# Patient Record
Sex: Female | Born: 1940 | Race: White | Hispanic: No | Smoking: Former smoker
Health system: Southern US, Community
[De-identification: ages and names within clinical notes are randomized; demographics above are authoritative.]

## PROBLEM LIST (undated history)

## (undated) DIAGNOSIS — E785 Hyperlipidemia, unspecified: Secondary | ICD-10-CM

## (undated) DIAGNOSIS — K219 Gastro-esophageal reflux disease without esophagitis: Secondary | ICD-10-CM

## (undated) DIAGNOSIS — C801 Malignant (primary) neoplasm, unspecified: Secondary | ICD-10-CM

## (undated) DIAGNOSIS — R112 Nausea with vomiting, unspecified: Secondary | ICD-10-CM

## (undated) DIAGNOSIS — R519 Headache, unspecified: Secondary | ICD-10-CM

## (undated) DIAGNOSIS — M1990 Unspecified osteoarthritis, unspecified site: Secondary | ICD-10-CM

## (undated) DIAGNOSIS — K589 Irritable bowel syndrome without diarrhea: Secondary | ICD-10-CM

## (undated) DIAGNOSIS — Z972 Presence of dental prosthetic device (complete) (partial): Secondary | ICD-10-CM

## (undated) DIAGNOSIS — Z9889 Other specified postprocedural states: Secondary | ICD-10-CM

## (undated) DIAGNOSIS — I10 Essential (primary) hypertension: Secondary | ICD-10-CM

## (undated) DIAGNOSIS — R51 Headache: Secondary | ICD-10-CM

## (undated) DIAGNOSIS — F419 Anxiety disorder, unspecified: Secondary | ICD-10-CM

## (undated) HISTORY — DX: Essential (primary) hypertension: I10

## (undated) HISTORY — PX: BREAST SURGERY: SHX581

## (undated) HISTORY — PX: HERNIA REPAIR: SHX51

## (undated) HISTORY — PX: CARDIAC CATHETERIZATION: SHX172

## (undated) HISTORY — DX: Hyperlipidemia, unspecified: E78.5

## (undated) HISTORY — DX: Gastro-esophageal reflux disease without esophagitis: K21.9

## (undated) HISTORY — PX: CHOLECYSTECTOMY: SHX55

## (undated) HISTORY — PX: TONSILLECTOMY: SUR1361

## (undated) HISTORY — PX: SHOULDER ARTHROSCOPY W/ ROTATOR CUFF REPAIR: SHX2400

## (undated) HISTORY — DX: Malignant (primary) neoplasm, unspecified: C80.1

---

## 2000-04-20 ENCOUNTER — Encounter (INDEPENDENT_AMBULATORY_CARE_PROVIDER_SITE_OTHER): Payer: Self-pay | Admitting: Specialist

## 2000-04-20 ENCOUNTER — Other Ambulatory Visit: Admission: RE | Admit: 2000-04-20 | Discharge: 2000-04-20 | Payer: Self-pay | Admitting: Gastroenterology

## 2000-09-07 ENCOUNTER — Ambulatory Visit (HOSPITAL_COMMUNITY): Admission: RE | Admit: 2000-09-07 | Discharge: 2000-09-07 | Payer: Self-pay | Admitting: Family Medicine

## 2000-09-07 ENCOUNTER — Encounter: Payer: Self-pay | Admitting: Family Medicine

## 2000-09-13 ENCOUNTER — Ambulatory Visit (HOSPITAL_COMMUNITY): Admission: RE | Admit: 2000-09-13 | Discharge: 2000-09-13 | Payer: Self-pay | Admitting: Family Medicine

## 2000-09-13 ENCOUNTER — Encounter: Payer: Self-pay | Admitting: Family Medicine

## 2000-09-30 ENCOUNTER — Encounter: Payer: Self-pay | Admitting: *Deleted

## 2000-09-30 ENCOUNTER — Emergency Department (HOSPITAL_COMMUNITY): Admission: EM | Admit: 2000-09-30 | Discharge: 2000-09-30 | Payer: Self-pay | Admitting: *Deleted

## 2001-02-15 ENCOUNTER — Ambulatory Visit (HOSPITAL_COMMUNITY): Admission: RE | Admit: 2001-02-15 | Discharge: 2001-02-15 | Payer: Self-pay | Admitting: General Surgery

## 2001-03-15 HISTORY — PX: MASTECTOMY: SHX3

## 2001-03-16 ENCOUNTER — Encounter: Payer: Self-pay | Admitting: Family Medicine

## 2001-03-16 ENCOUNTER — Ambulatory Visit (HOSPITAL_COMMUNITY): Admission: RE | Admit: 2001-03-16 | Discharge: 2001-03-16 | Payer: Self-pay | Admitting: Family Medicine

## 2001-03-21 ENCOUNTER — Encounter (INDEPENDENT_AMBULATORY_CARE_PROVIDER_SITE_OTHER): Payer: Self-pay | Admitting: Specialist

## 2001-03-21 ENCOUNTER — Encounter: Payer: Self-pay | Admitting: Family Medicine

## 2001-03-21 ENCOUNTER — Encounter: Admission: RE | Admit: 2001-03-21 | Discharge: 2001-03-21 | Payer: Self-pay | Admitting: Family Medicine

## 2001-03-24 ENCOUNTER — Encounter: Admission: RE | Admit: 2001-03-24 | Discharge: 2001-03-24 | Payer: Self-pay | Admitting: General Surgery

## 2001-03-24 ENCOUNTER — Encounter: Payer: Self-pay | Admitting: General Surgery

## 2001-03-28 ENCOUNTER — Encounter: Admission: RE | Admit: 2001-03-28 | Discharge: 2001-03-28 | Payer: Self-pay | Admitting: General Surgery

## 2001-03-28 ENCOUNTER — Ambulatory Visit (HOSPITAL_BASED_OUTPATIENT_CLINIC_OR_DEPARTMENT_OTHER): Admission: RE | Admit: 2001-03-28 | Discharge: 2001-03-28 | Payer: Self-pay | Admitting: General Surgery

## 2001-03-28 ENCOUNTER — Encounter (INDEPENDENT_AMBULATORY_CARE_PROVIDER_SITE_OTHER): Payer: Self-pay | Admitting: Specialist

## 2001-03-28 ENCOUNTER — Encounter: Payer: Self-pay | Admitting: General Surgery

## 2001-04-06 ENCOUNTER — Ambulatory Visit: Admission: RE | Admit: 2001-04-06 | Discharge: 2001-07-05 | Payer: Self-pay | Admitting: *Deleted

## 2001-04-26 ENCOUNTER — Encounter: Admission: RE | Admit: 2001-04-26 | Discharge: 2001-04-26 | Payer: Self-pay | Admitting: *Deleted

## 2001-05-01 ENCOUNTER — Ambulatory Visit (HOSPITAL_COMMUNITY): Admission: RE | Admit: 2001-05-01 | Discharge: 2001-05-01 | Payer: Self-pay | Admitting: *Deleted

## 2001-05-01 ENCOUNTER — Encounter: Payer: Self-pay | Admitting: *Deleted

## 2001-05-03 ENCOUNTER — Ambulatory Visit (HOSPITAL_BASED_OUTPATIENT_CLINIC_OR_DEPARTMENT_OTHER): Admission: RE | Admit: 2001-05-03 | Discharge: 2001-05-03 | Payer: Self-pay | Admitting: General Surgery

## 2001-05-03 ENCOUNTER — Encounter: Payer: Self-pay | Admitting: General Surgery

## 2001-05-05 ENCOUNTER — Ambulatory Visit (HOSPITAL_COMMUNITY): Admission: RE | Admit: 2001-05-05 | Discharge: 2001-05-05 | Payer: Self-pay | Admitting: *Deleted

## 2001-05-05 ENCOUNTER — Encounter: Payer: Self-pay | Admitting: *Deleted

## 2001-05-16 ENCOUNTER — Encounter: Payer: Self-pay | Admitting: *Deleted

## 2001-05-16 ENCOUNTER — Emergency Department (HOSPITAL_COMMUNITY): Admission: EM | Admit: 2001-05-16 | Discharge: 2001-05-16 | Payer: Self-pay | Admitting: Emergency Medicine

## 2001-07-26 ENCOUNTER — Ambulatory Visit: Admission: RE | Admit: 2001-07-26 | Discharge: 2001-10-24 | Payer: Self-pay | Admitting: *Deleted

## 2001-10-04 ENCOUNTER — Ambulatory Visit (HOSPITAL_BASED_OUTPATIENT_CLINIC_OR_DEPARTMENT_OTHER): Admission: RE | Admit: 2001-10-04 | Discharge: 2001-10-04 | Payer: Self-pay | Admitting: General Surgery

## 2001-12-15 ENCOUNTER — Encounter: Admission: RE | Admit: 2001-12-15 | Discharge: 2001-12-15 | Payer: Self-pay | Admitting: General Surgery

## 2001-12-15 ENCOUNTER — Encounter: Payer: Self-pay | Admitting: General Surgery

## 2002-02-23 ENCOUNTER — Encounter: Payer: Self-pay | Admitting: *Deleted

## 2002-02-23 ENCOUNTER — Ambulatory Visit (HOSPITAL_COMMUNITY): Admission: RE | Admit: 2002-02-23 | Discharge: 2002-02-23 | Payer: Self-pay

## 2002-07-09 ENCOUNTER — Emergency Department (HOSPITAL_COMMUNITY): Admission: EM | Admit: 2002-07-09 | Discharge: 2002-07-09 | Payer: Self-pay | Admitting: Emergency Medicine

## 2002-07-11 ENCOUNTER — Encounter: Admission: RE | Admit: 2002-07-11 | Discharge: 2002-07-11 | Payer: Self-pay | Admitting: *Deleted

## 2002-07-11 ENCOUNTER — Encounter: Payer: Self-pay | Admitting: *Deleted

## 2002-07-26 ENCOUNTER — Other Ambulatory Visit: Admission: RE | Admit: 2002-07-26 | Discharge: 2002-07-26 | Payer: Self-pay | Admitting: *Deleted

## 2002-09-13 ENCOUNTER — Encounter (INDEPENDENT_AMBULATORY_CARE_PROVIDER_SITE_OTHER): Payer: Self-pay | Admitting: *Deleted

## 2002-09-13 ENCOUNTER — Ambulatory Visit (HOSPITAL_COMMUNITY): Admission: RE | Admit: 2002-09-13 | Discharge: 2002-09-13 | Payer: Self-pay | Admitting: *Deleted

## 2002-12-17 ENCOUNTER — Encounter: Payer: Self-pay | Admitting: Oncology

## 2002-12-17 ENCOUNTER — Encounter: Admission: RE | Admit: 2002-12-17 | Discharge: 2002-12-17 | Payer: Self-pay | Admitting: Oncology

## 2003-12-18 ENCOUNTER — Encounter (INDEPENDENT_AMBULATORY_CARE_PROVIDER_SITE_OTHER): Payer: Self-pay | Admitting: *Deleted

## 2003-12-18 ENCOUNTER — Encounter: Admission: RE | Admit: 2003-12-18 | Discharge: 2003-12-18 | Payer: Self-pay | Admitting: General Surgery

## 2004-04-17 ENCOUNTER — Ambulatory Visit: Payer: Self-pay | Admitting: Oncology

## 2004-06-17 ENCOUNTER — Encounter: Admission: RE | Admit: 2004-06-17 | Discharge: 2004-06-17 | Payer: Self-pay | Admitting: Oncology

## 2004-12-21 ENCOUNTER — Encounter: Admission: RE | Admit: 2004-12-21 | Discharge: 2004-12-21 | Payer: Self-pay | Admitting: Oncology

## 2005-04-16 ENCOUNTER — Ambulatory Visit: Payer: Self-pay | Admitting: Oncology

## 2005-04-26 ENCOUNTER — Ambulatory Visit: Payer: Self-pay | Admitting: Gastroenterology

## 2005-04-27 ENCOUNTER — Ambulatory Visit: Payer: Self-pay | Admitting: Gastroenterology

## 2005-10-28 ENCOUNTER — Ambulatory Visit: Payer: Self-pay | Admitting: Oncology

## 2005-11-03 ENCOUNTER — Ambulatory Visit: Payer: Self-pay | Admitting: Oncology

## 2005-11-03 LAB — CBC WITH DIFFERENTIAL/PLATELET
BASO%: 0.4 % (ref 0.0–2.0)
Basophils Absolute: 0 10*3/uL (ref 0.0–0.1)
EOS%: 2.5 % (ref 0.0–7.0)
Eosinophils Absolute: 0.2 10*3/uL (ref 0.0–0.5)
HCT: 38.9 % (ref 34.8–46.6)
HGB: 13.7 g/dL (ref 11.6–15.9)
LYMPH%: 34.6 % (ref 14.0–48.0)
MCH: 30.5 pg (ref 26.0–34.0)
MCHC: 35.2 g/dL (ref 32.0–36.0)
MCV: 86.4 fL (ref 81.0–101.0)
MONO#: 0.4 10*3/uL (ref 0.1–0.9)
MONO%: 7 % (ref 0.0–13.0)
NEUT#: 3.4 10*3/uL (ref 1.5–6.5)
NEUT%: 55.5 % (ref 39.6–76.8)
Platelets: 212 10*3/uL (ref 145–400)
RBC: 4.5 10*6/uL (ref 3.70–5.32)
RDW: 12.3 % (ref 11.3–14.5)
WBC: 6.1 10*3/uL (ref 3.9–10.0)
lymph#: 2.1 10*3/uL (ref 0.9–3.3)

## 2005-12-13 ENCOUNTER — Encounter: Admission: RE | Admit: 2005-12-13 | Discharge: 2005-12-13 | Payer: Self-pay | Admitting: Oncology

## 2006-04-08 ENCOUNTER — Ambulatory Visit: Payer: Self-pay | Admitting: Oncology

## 2006-04-12 LAB — CBC WITH DIFFERENTIAL/PLATELET
BASO%: 0.5 % (ref 0.0–2.0)
Basophils Absolute: 0 10*3/uL (ref 0.0–0.1)
EOS%: 2.6 % (ref 0.0–7.0)
Eosinophils Absolute: 0.2 10*3/uL (ref 0.0–0.5)
HCT: 41.1 % (ref 34.8–46.6)
HGB: 14.2 g/dL (ref 11.6–15.9)
LYMPH%: 32.5 % (ref 14.0–48.0)
MCH: 30.9 pg (ref 26.0–34.0)
MCHC: 34.7 g/dL (ref 32.0–36.0)
MCV: 89 fL (ref 81.0–101.0)
MONO#: 0.5 10*3/uL (ref 0.1–0.9)
MONO%: 7 % (ref 0.0–13.0)
NEUT#: 3.7 10*3/uL (ref 1.5–6.5)
NEUT%: 57.4 % (ref 39.6–76.8)
Platelets: 260 10*3/uL (ref 145–400)
RBC: 4.61 10*6/uL (ref 3.70–5.32)
RDW: 12.1 % (ref 11.3–14.5)
WBC: 6.5 10*3/uL (ref 3.9–10.0)
lymph#: 2.1 10*3/uL (ref 0.9–3.3)

## 2006-04-12 LAB — COMPREHENSIVE METABOLIC PANEL
ALT: 18 U/L (ref 0–35)
Albumin: 4.6 g/dL (ref 3.5–5.2)
CO2: 27 mEq/L (ref 19–32)
Calcium: 9.5 mg/dL (ref 8.4–10.5)
Chloride: 100 mEq/L (ref 96–112)
Glucose, Bld: 168 mg/dL — ABNORMAL HIGH (ref 70–99)
Potassium: 4.1 mEq/L (ref 3.5–5.3)
Sodium: 137 mEq/L (ref 135–145)
Total Bilirubin: 0.6 mg/dL (ref 0.3–1.2)
Total Protein: 7.1 g/dL (ref 6.0–8.3)

## 2006-04-12 LAB — CANCER ANTIGEN 27.29: CA 27.29: 29 U/mL (ref 0–39)

## 2006-07-14 ENCOUNTER — Encounter: Admission: RE | Admit: 2006-07-14 | Discharge: 2006-07-14 | Payer: Self-pay | Admitting: Oncology

## 2006-11-09 ENCOUNTER — Ambulatory Visit: Payer: Self-pay | Admitting: Oncology

## 2006-11-10 LAB — CBC WITH DIFFERENTIAL/PLATELET
BASO%: 0.5 % (ref 0.0–2.0)
EOS%: 2.9 % (ref 0.0–7.0)
MCH: 30.7 pg (ref 26.0–34.0)
MCHC: 35.5 g/dL (ref 32.0–36.0)
MONO#: 0.4 10*3/uL (ref 0.1–0.9)
NEUT%: 54.4 % (ref 39.6–76.8)
RBC: 4.37 10*6/uL (ref 3.70–5.32)
RDW: 12.3 % (ref 11.3–14.5)
WBC: 6.1 10*3/uL (ref 3.9–10.0)
lymph#: 2.2 10*3/uL (ref 0.9–3.3)

## 2006-11-14 LAB — CANCER ANTIGEN 27.29: CA 27.29: 22 U/mL (ref 0–39)

## 2006-11-14 LAB — COMPREHENSIVE METABOLIC PANEL
ALT: 11 U/L (ref 0–35)
AST: 11 U/L (ref 0–37)
CO2: 24 mEq/L (ref 19–32)
Calcium: 9.8 mg/dL (ref 8.4–10.5)
Chloride: 101 mEq/L (ref 96–112)
Creatinine, Ser: 0.75 mg/dL (ref 0.40–1.20)
Sodium: 138 mEq/L (ref 135–145)
Total Bilirubin: 0.5 mg/dL (ref 0.3–1.2)
Total Protein: 6.6 g/dL (ref 6.0–8.3)

## 2006-11-14 LAB — VITAMIN D PNL(25-HYDRXY+1,25-DIHY)-BLD: Vit D, 1,25-Dihydroxy: 55 pg/mL (ref 6–62)

## 2006-12-15 ENCOUNTER — Encounter: Admission: RE | Admit: 2006-12-15 | Discharge: 2006-12-15 | Payer: Self-pay | Admitting: Oncology

## 2007-04-21 ENCOUNTER — Ambulatory Visit: Payer: Self-pay | Admitting: Oncology

## 2007-04-25 LAB — CBC WITH DIFFERENTIAL/PLATELET
BASO%: 0.4 % (ref 0.0–2.0)
Basophils Absolute: 0 10*3/uL (ref 0.0–0.1)
EOS%: 1.5 % (ref 0.0–7.0)
Eosinophils Absolute: 0.1 10*3/uL (ref 0.0–0.5)
HCT: 40.7 % (ref 34.8–46.6)
HGB: 13.4 g/dL (ref 11.6–15.9)
LYMPH%: 24.2 % (ref 14.0–48.0)
MCH: 28.3 pg (ref 26.0–34.0)
MCHC: 32.9 g/dL (ref 32.0–36.0)
MCV: 86 fL (ref 81.0–101.0)
MONO#: 0.5 10*3/uL (ref 0.1–0.9)
MONO%: 5.7 % (ref 0.0–13.0)
NEUT#: 5.5 10*3/uL (ref 1.5–6.5)
NEUT%: 68.2 % (ref 39.6–76.8)
Platelets: 272 10*3/uL (ref 145–400)
RBC: 4.73 10*6/uL (ref 3.70–5.32)
RDW: 12.4 % (ref 11.3–14.5)
WBC: 8.1 10*3/uL (ref 3.9–10.0)
lymph#: 2 10*3/uL (ref 0.9–3.3)

## 2007-04-25 LAB — COMPREHENSIVE METABOLIC PANEL
ALT: 19 U/L (ref 0–35)
AST: 20 U/L (ref 0–37)
Albumin: 4 g/dL (ref 3.5–5.2)
Alkaline Phosphatase: 52 U/L (ref 39–117)
BUN: 8 mg/dL (ref 6–23)
CO2: 31 mEq/L (ref 19–32)
Calcium: 10.1 mg/dL (ref 8.4–10.5)
Chloride: 101 mEq/L (ref 96–112)
Creatinine, Ser: 0.74 mg/dL (ref 0.40–1.20)
Glucose, Bld: 166 mg/dL — ABNORMAL HIGH (ref 70–99)
Potassium: 3.3 mEq/L — ABNORMAL LOW (ref 3.5–5.3)
Sodium: 140 mEq/L (ref 135–145)
Total Bilirubin: 0.9 mg/dL (ref 0.3–1.2)
Total Protein: 6.7 g/dL (ref 6.0–8.3)

## 2007-04-25 LAB — CANCER ANTIGEN 27.29: CA 27.29: 17 U/mL (ref 0–39)

## 2007-11-03 ENCOUNTER — Encounter: Admission: RE | Admit: 2007-11-03 | Discharge: 2007-11-03 | Payer: Self-pay | Admitting: General Surgery

## 2007-12-19 ENCOUNTER — Encounter: Admission: RE | Admit: 2007-12-19 | Discharge: 2007-12-19 | Payer: Self-pay | Admitting: General Surgery

## 2008-04-12 ENCOUNTER — Ambulatory Visit: Payer: Self-pay | Admitting: Oncology

## 2008-04-16 LAB — COMPREHENSIVE METABOLIC PANEL
ALT: 11 U/L (ref 0–35)
AST: 12 U/L (ref 0–37)
Albumin: 4.1 g/dL (ref 3.5–5.2)
Alkaline Phosphatase: 61 U/L (ref 39–117)
BUN: 11 mg/dL (ref 6–23)
CO2: 27 mEq/L (ref 19–32)
Calcium: 10 mg/dL (ref 8.4–10.5)
Chloride: 99 mEq/L (ref 96–112)
Creatinine, Ser: 0.67 mg/dL (ref 0.40–1.20)
Glucose, Bld: 197 mg/dL — ABNORMAL HIGH (ref 70–99)
Potassium: 4.4 mEq/L (ref 3.5–5.3)
Sodium: 136 mEq/L (ref 135–145)
Total Bilirubin: 0.3 mg/dL (ref 0.3–1.2)
Total Protein: 6.7 g/dL (ref 6.0–8.3)

## 2008-04-16 LAB — CBC WITH DIFFERENTIAL/PLATELET
BASO%: 0.3 % (ref 0.0–2.0)
Basophils Absolute: 0 10*3/uL (ref 0.0–0.1)
EOS%: 2.2 % (ref 0.0–7.0)
Eosinophils Absolute: 0.1 10*3/uL (ref 0.0–0.5)
HCT: 37 % (ref 34.8–46.6)
HGB: 12.8 g/dL (ref 11.6–15.9)
LYMPH%: 28.5 % (ref 14.0–48.0)
MCH: 30.4 pg (ref 26.0–34.0)
MCHC: 34.6 g/dL (ref 32.0–36.0)
MCV: 87.8 fL (ref 81.0–101.0)
MONO#: 0.4 10*3/uL (ref 0.1–0.9)
MONO%: 5.8 % (ref 0.0–13.0)
NEUT#: 4.2 10*3/uL (ref 1.5–6.5)
NEUT%: 63.2 % (ref 39.6–76.8)
Platelets: ADEQUATE 10*3/uL (ref 145–400)
RBC: 4.21 10*6/uL (ref 3.70–5.32)
RDW: 12.3 % (ref 11.3–14.5)
WBC: 6.7 10*3/uL (ref 3.9–10.0)
lymph#: 1.9 10*3/uL (ref 0.9–3.3)

## 2008-04-16 LAB — CANCER ANTIGEN 27.29: CA 27.29: 17 U/mL (ref 0–39)

## 2008-05-28 ENCOUNTER — Encounter: Admission: RE | Admit: 2008-05-28 | Discharge: 2008-07-23 | Payer: Self-pay | Admitting: Endocrinology

## 2008-09-24 ENCOUNTER — Encounter: Admission: RE | Admit: 2008-09-24 | Discharge: 2008-09-24 | Payer: Self-pay | Admitting: Endocrinology

## 2008-12-19 ENCOUNTER — Encounter: Admission: RE | Admit: 2008-12-19 | Discharge: 2008-12-19 | Payer: Self-pay | Admitting: General Surgery

## 2009-04-18 ENCOUNTER — Ambulatory Visit: Payer: Self-pay | Admitting: Oncology

## 2009-04-22 ENCOUNTER — Encounter: Payer: Self-pay | Admitting: Gastroenterology

## 2009-04-22 LAB — COMPREHENSIVE METABOLIC PANEL
ALT: 13 U/L (ref 0–35)
AST: 17 U/L (ref 0–37)
Albumin: 4.1 g/dL (ref 3.5–5.2)
Alkaline Phosphatase: 52 U/L (ref 39–117)
BUN: 11 mg/dL (ref 6–23)
CO2: 30 mEq/L (ref 19–32)
Calcium: 9.9 mg/dL (ref 8.4–10.5)
Chloride: 97 mEq/L (ref 96–112)
Creatinine, Ser: 0.71 mg/dL (ref 0.40–1.20)
Glucose, Bld: 109 mg/dL — ABNORMAL HIGH (ref 70–99)
Potassium: 3.8 mEq/L (ref 3.5–5.3)
Sodium: 136 mEq/L (ref 135–145)
Total Bilirubin: 0.6 mg/dL (ref 0.3–1.2)
Total Protein: 7.2 g/dL (ref 6.0–8.3)

## 2009-04-22 LAB — CBC WITH DIFFERENTIAL/PLATELET
BASO%: 0.5 % (ref 0.0–2.0)
Basophils Absolute: 0 10*3/uL (ref 0.0–0.1)
EOS%: 2.6 % (ref 0.0–7.0)
Eosinophils Absolute: 0.2 10*3/uL (ref 0.0–0.5)
HCT: 39.3 % (ref 34.8–46.6)
HGB: 13.6 g/dL (ref 11.6–15.9)
LYMPH%: 29.6 % (ref 14.0–49.7)
MCH: 30.5 pg (ref 25.1–34.0)
MCHC: 34.5 g/dL (ref 31.5–36.0)
MCV: 88.2 fL (ref 79.5–101.0)
MONO#: 0.5 10*3/uL (ref 0.1–0.9)
MONO%: 7 % (ref 0.0–14.0)
NEUT#: 3.9 10*3/uL (ref 1.5–6.5)
NEUT%: 60.3 % (ref 38.4–76.8)
Platelets: 228 10*3/uL (ref 145–400)
RBC: 4.45 10*6/uL (ref 3.70–5.45)
RDW: 12 % (ref 11.2–14.5)
WBC: 6.5 10*3/uL (ref 3.9–10.3)
lymph#: 1.9 10*3/uL (ref 0.9–3.3)

## 2009-12-19 ENCOUNTER — Encounter: Admission: RE | Admit: 2009-12-19 | Discharge: 2009-12-19 | Payer: Self-pay | Admitting: Family Medicine

## 2010-04-08 ENCOUNTER — Encounter: Payer: Self-pay | Admitting: Gastroenterology

## 2010-04-16 NOTE — Letter (Signed)
[facility]   Imported By: Sherian Rein 05/12/2009 11:04:46  _____________________________________________________________________  External Attachment:    Type:   Image     Comment:   External Document

## 2010-04-16 NOTE — Letter (Signed)
Summary: Colonoscopy Letter  [facility]  [address] [place], [place] [address]   Phone: [phone]  Fax: [phone]      April 08, 2010 MRN: [id number]   Jiya Mizell [address] [place], [place]  [address]   Dear Ms. Vejar,   According to your medical record, it is time for you to schedule a Colonoscopy. The American Cancer Society recommends this procedure as a method to detect early colon cancer. Patients with a family history of colon cancer, or a personal history of colon polyps or inflammatory bowel disease are at increased risk.  This letter has been generated based on the recommendations made at the time of your procedure. If you feel that in your particular situation this may no longer apply, please contact our office.  Please call our office at [phone] to schedule this appointment or to update your records at your earliest convenience.  Thank you for cooperating with [place] to provide you with the very best care possible.   Sincerely,  Judie Petit T. Russella Dar, M.D.  [facility] [phone]

## 2010-04-23 ENCOUNTER — Encounter: Payer: Self-pay | Admitting: Gastroenterology

## 2010-04-23 ENCOUNTER — Encounter (HOSPITAL_BASED_OUTPATIENT_CLINIC_OR_DEPARTMENT_OTHER): Payer: MEDICARE | Admitting: Oncology

## 2010-04-23 ENCOUNTER — Other Ambulatory Visit: Payer: Self-pay | Admitting: Oncology

## 2010-04-23 DIAGNOSIS — C50419 Malignant neoplasm of upper-outer quadrant of unspecified female breast: Secondary | ICD-10-CM

## 2010-04-23 DIAGNOSIS — Z170 Estrogen receptor positive status [ER+]: Secondary | ICD-10-CM

## 2010-04-23 LAB — COMPREHENSIVE METABOLIC PANEL
ALT: 13 U/L (ref 0–35)
AST: 17 U/L (ref 0–37)
Albumin: 3.9 g/dL (ref 3.5–5.2)
Alkaline Phosphatase: 47 U/L (ref 39–117)
BUN: 11 mg/dL (ref 6–23)
CO2: 30 mEq/L (ref 19–32)
Calcium: 9.7 mg/dL (ref 8.4–10.5)
Chloride: 101 mEq/L (ref 96–112)
Creatinine, Ser: 0.76 mg/dL (ref 0.40–1.20)
Glucose, Bld: 131 mg/dL — ABNORMAL HIGH (ref 70–99)
Potassium: 3.3 mEq/L — ABNORMAL LOW (ref 3.5–5.3)
Sodium: 139 mEq/L (ref 135–145)
Total Bilirubin: 0.9 mg/dL (ref 0.3–1.2)
Total Protein: 6.6 g/dL (ref 6.0–8.3)

## 2010-04-23 LAB — CBC WITH DIFFERENTIAL/PLATELET
BASO%: 0.5 % (ref 0.0–2.0)
Basophils Absolute: 0 10*3/uL (ref 0.0–0.1)
EOS%: 1.8 % (ref 0.0–7.0)
Eosinophils Absolute: 0.1 10*3/uL (ref 0.0–0.5)
HCT: 38.1 % (ref 34.8–46.6)
HGB: 13.1 g/dL (ref 11.6–15.9)
LYMPH%: 23.7 % (ref 14.0–49.7)
MCH: 30.2 pg (ref 25.1–34.0)
MCHC: 34.5 g/dL (ref 31.5–36.0)
MCV: 87.5 fL (ref 79.5–101.0)
MONO#: 0.4 10*3/uL (ref 0.1–0.9)
MONO%: 6.8 % (ref 0.0–14.0)
NEUT#: 4.3 10*3/uL (ref 1.5–6.5)
NEUT%: 67.2 % (ref 38.4–76.8)
Platelets: 195 10*3/uL (ref 145–400)
RBC: 4.35 10*6/uL (ref 3.70–5.45)
RDW: 12.4 % (ref 11.2–14.5)
WBC: 6.4 10*3/uL (ref 3.9–10.3)
lymph#: 1.5 10*3/uL (ref 0.9–3.3)

## 2010-05-21 NOTE — Letter (Signed)
Summary: [facility]  [facility]   Imported By: Lennie Odor 05/15/2010 11:37:15  _____________________________________________________________________  External Attachment:    Type:   Image     Comment:   External Document

## 2010-07-31 NOTE — Op Note (Signed)
[place]  Patient:    Leah Hall, Leah Hall Visit Number: [id number] MRN: [id number]          Service Type: DSU Location: [facility] Attending Physician:  Janalyn Rouse Proc. Date: 05/03/01 Admit Date:  03/28/2001 Discharge Date: 03/28/2001   CC:         Aliene Altes, M.D.   Operative Report  PREOPERATIVE DIAGNOSIS:  Carcinoma of the left breast.  POSTOPERATIVE DIAGNOSIS:  Carcinoma of the left breast.  PROCEDURE PERFORMED:  Port-A-Cath insertion.  SURGEON:  Rose Phi. Maple Hudson, M.D.  ANESTHESIA:  MAC.  DESCRIPTION OF PROCEDURE:  The patient was placed on the operating table with the arms down by the side and a roll between the shoulders.  After prepping and draping, a right subclavian puncture was carried out without difficulty, and the guidewire inserted, and proper position of the guidewire confirmed by fluoroscopy.  We then developed a pocket on the anterior chest wall and then tunnelled between the subclavian puncture site and pocket, and passed a preconnected Bard implantable port.  The port was then anchored in the pocket with two 2-0 Prolene sutures.  The tip of the catheter was then cut to go to the fourth interspace.  We then passed the dilator and peel away sheath over the wire and then removed the wire and the dilator.  We then passed the catheter which we did with some difficulty through the peel away sheath, and after getting it inserted, and having removed the sheath, it was clear that the catheter had then gone across into the left subclavian.  Under direct vision, I was able to pull the catheter back and manipulate it and have it go south into the vena cava to the right atrium.  The incisions were closed with 3-0 Vicryl and subcuticular 4-0 Monocryl with Steri-Strips.  The system was fully heparinized.  A dressing was applied and she was transferred to the recovery room in satisfactory condition having tolerated the procedure  well. Attending Physician:  Janalyn Rouse DD:  05/03/01 TD:  05/03/01 Job: [id number]

## 2010-07-31 NOTE — H&P (Signed)
NAME:  Leah Hall, Leah Hall                        ACCOUNT NO.:  [id number]   MEDICAL RECORD NO.:  [id number]                   PATIENT TYPE:  AMB   LOCATION:  [facility]                                  FACILITY:  [facility]   PHYSICIAN:  [place] B. Earlene Plater, M.D.               DATE OF BIRTH:  Oct 20, 1940   DATE OF ADMISSION:  09/13/2002  DATE OF DISCHARGE:                                HISTORY & PHYSICAL   PREOPERATIVE DIAGNOSES:  1. History of Tamoxifen use.  2. Low abdominal pain.  3. Endometrial polyps suspected on saline infusion ultrasound.   HISTORY OF PRESENT ILLNESS:  The patient is a 70 year old white female  gravida 1, para 1 initially seen for complaints of lower abdominal pain,  primarily bilateral lower quadrant, intermittent, moderate intensity.  Subsequent pelvic ultrasound in the office showed no abnormalities of the  ovaries, although an endometrial mass was detected.  Subsequent saline  ultrasound was performed suggestive of endometrial polyp.  Given her history  of Tamoxifen use recommend surgical evaluation.  With regard to the  patient's low abdominal pain it is thought to be more likely irritable bowel  symptoms or diverticulosis.   PAST MEDICAL HISTORY:  1. Breast cancer in 2003 followed by Valentino Hue. Magrinat, M.D. and Dr. Maple Hudson.  2. History of lumbar vertebrae fracture after a fall.  3. Osteopenia.  4. IBS.  5. Diverticulosis.   PAST SURGICAL HISTORY:  1. Cholecystectomy.  2. Ventral hernia repair.  3. Breast lumpectomy.   MEDICATIONS:  1. Xanax 0.5 mg one tablet q.8h. p.r.n. anxiety.  2. Amitriptyline 25 mg one nightly.  3. NuLev 0.124 mg one to two p.r.n.  4. Nexium 40 mg one daily.  5. Hydrochlorothiazide 25 mg one-half tablet daily.  6. Clonidine HCl 0.1 mg one daily.  7. Meclizine 25 mg one p.o. t.i.d. as needed for dizziness.   ALLERGIES:  CODEINE.   SOCIAL HISTORY:  No alcohol, tobacco, or other drugs.   FAMILY HISTORY:  Noncontributory.   REVIEW OF  SYSTEMS:  Otherwise negative.   PHYSICAL EXAMINATION:  VITAL SIGNS:  Blood pressure 138/74, pulse 80,  temperature 97.7, weight 153.  GENERAL:  Alert, oriented, in no acute distress.  SKIN:  Warm, dry, without lesions.  HEART:  Regular rate and rhythm.  LUNGS:  Clear to auscultation.  ABDOMEN:  No palpable hernia.  No mass palpable.  LYMPH NODE SURVEY:  Negative in the neck, axilla, and groin.  BREASTS:  No dominant masses, nipple discharge, adenopathy.  PELVIC:  Normal external genitalia.  Vagina and cervix normal.  Uterus  normal size and nontender.  No adnexal mass or tenderness.  Rectovaginal  examination confirms above findings.   LABORATORIES:  Saline infusion ultrasound suggestive of a 2 cm posterior  wall polyp.   ASSESSMENT:  1. Probable endometrial polyp.  2. History of Tamoxifen use.   PLAN:  Hysteroscopy, polyp removal.  Operative risks discussed  including  infection, bleeding, perforation, damage to bowel, bladder, surrounding  organs, and fluid overload.  All questions answered.  The patient wished to  proceed.                                               Gerri Spore B. Earlene Plater, M.D.    WBD/MEDQ  D:  09/11/2002  T:  09/11/2002  Job:  [id number]

## 2010-07-31 NOTE — H&P (Signed)
[facility]  Patient:    Leah Hall, SEDIVY Visit Number: [id number] MRN: [id number]          Service Type: Attending:  Franky Macho, M.D. Dictated by:   Franky Macho, M.D. Adm. Date:  02/15/01   CC:         Colette Ribas, M.D.   History and Physical  DATE OF BIRTH:  [birth year]  CHIEF COMPLAINT:  Dysphagia.  HISTORY OF PRESENT ILLNESS:  The patient is a 70 year old white female who is referred for evaluation and treatment of dysphagia.  She has been having some solid food get stuck in the esophagus and neck area.  Some vomiting of undigested food has been noted.  She has had gastroesophageal reflux disease for many years.  Nexium seems to be helpful.  She had an EGD many years ago which showed reflux disease but no stricture.  No nausea, epigastric pain, or indigestion noted.  PAST MEDICAL HISTORY: 1. Anxiety disorder. 2. Irritable bowel disease. 3. Chronic reflux.  PAST SURGICAL HISTORY: 1. Cholecystectomy. 2. Breast biopsies. 3. Ventral herniorrhaphy. 4. Rotator cuff repair of the right shoulder. 5. Tonsillectomy and adenoidectomy.  CURRENT MEDICATIONS:  Amitriptyline, alprazolam, hyoscyamine for irritable bowel, Nexium, calcium supplements.  ALLERGIES:  CODEINE.  SOCIAL HISTORY:  The patient denies drinking or smoking.  REVIEW OF SYSTEMS:  She denies any recent chest pain, shortness of breath, leg swelling, CVA, diabetes mellitus, or MI.  She denies any bleeding disorders.  PHYSICAL EXAMINATION:  GENERAL:  Well-developed, well-nourished white female in no acute distress.  VITAL SIGNS:  Afebrile, vital signs stable.  NECK:  Supple.  Without lymphadenopathy.  LUNGS:  Clear to auscultation, with equal breath sounds bilaterally.  HEART:  Regular rate and rhythm without S3, S4, or murmurs.  ABDOMEN:  Soft, nontender, nondistended.  No hepatosplenomegaly or masses are noted.  IMPRESSION:  Dysphagia.  PLAN:  The patient  is scheduled for an EGD on February 15, 2001.  The risks and benefits of the procedure including bleeding, were fully explained to the patient, who gave informed consent. Dictated by:   Franky Macho, M.D. Attending:  Franky Macho, M.D. DD:  02/07/01 TD:  02/07/01 Job: [id number]

## 2010-07-31 NOTE — Op Note (Signed)
[place]  Patient:    Leah Hall, Leah Hall Visit Number: [id number] MRN: [id number]          Service Type: Attending:  Rose Phi. Maple Hudson, M.D. Dictated by:   Rose Phi. Maple Hudson, M.D. Proc. Date: 03/28/01                             Operative Report  PREOPERATIVE DIAGNOSIS:  Intraductal carcinoma of the left breast.  POSTOPERATIVE DIAGNOSIS:  Intraductal carcinoma of the left breast.  OPERATION PERFORMED: 1. Blue dye injection. 2. Left partial mastectomy with needle localization and specimen mammography. 3. Left axillary sentinel lymph node biopsy.  SURGEON:  Rose Phi. Maple Hudson, M.D.  ANESTHESIA:  General.  INDICATIONS FOR PROCEDURE:  This patient had presented with an abnormal left breast mammogram with some microcalcifications.  Core needle biopsy had shown high grade ductal carcinoma in situ and they could not rule out microinvasion. She is being brought to the operating room now for definitive treatment. Prior to coming to the operating room 1 mCi of technetium sulfur colloid was injected intradermally around the areolar margin and she had had the previously placed wire for identification of this area.  DESCRIPTION OF PROCEDURE:  After suitable general anesthesia was induced, the patient was placed in supine position and left prepped and draped in the usual fashion.  4 cc of Lymphazurin blue was injected subareolarly and the breast massaged for five minutes.  After prepping and draping, a curved incision was made and we delivered the previously placed wire in the incision and then did a wide excision of the wire and the surrounding tissue.  Specimen mammogram confirmed the removal of the lesion and the specimen was submitted to the pathologist for margin evaluation.  While that was being done, we scanned the axilla with the Neoprobe and had a hot spot which was marked and then a small transverse axillary incision was made with dissection down to  the clavipectoral fascia.  Dividing that a large blue and hot lymph node was identified and removed with clipping of the afferent and efferent lymphatics.  There were no other hot spots and no palpable nodes.  The tissue was closed with 3-0 Vicryl and subcuticular 4-0 Monocryl and Steri-Strips.  Partial mastectomy incision was closed in similar fashion. Touch Preps on the nodes were negative.  Dressings were applied.  The patient was transferred to the recovery room in satisfactory condition having tolerated the procedure well. Dictated by:   Rose Phi. Maple Hudson, M.D. Attending:  Rose Phi. Maple Hudson, M.D. DD:  03/28/01 TD:  03/28/01 Job: [id number]

## 2010-07-31 NOTE — Op Note (Signed)
   NAME:  Leah Hall, Leah Hall                        ACCOUNT NO.:  [id number]   MEDICAL RECORD NO.:  [id number]                   PATIENT TYPE:  AMB   LOCATION:  [facility]                                  [facility]ACILITY:  [facility]   PHYSICIAN:  [place] B. Earlene Plater, M.D.               DATE OF BIRTH:  [birth year]   DATE OF PROCEDURE:  09/13/2002  DATE OF DISCHARGE:                                 OPERATIVE REPORT   PREOPERATIVE DIAGNOSES:  1. Saline ultrasound suggestive of endometrial mass.  2. History of tamoxifen use.   POSTOPERATIVE DIAGNOSES:  1. Saline ultrasound suggestive of endometrial mass.  2. History of tamoxifen use.   PROCEDURE:  Hysteroscopy, D&C.   SURGEON:  [place] B. Earlene Plater, M.D.   ANESTHESIA:  LMA general.   FINDINGS:  No focal lesion noted on hysteroscopy.  Atrophic appearing  endometrium.   SPECIMENS:  Endometrial curettings.   FLUID DEFICIT:  20 mL Sorbitol.   ESTIMATED BLOOD LOSS:  Less than 50 mL.   COMPLICATIONS:  None.   INDICATIONS FOR PROCEDURE:  The patient with a history of breast cancer and  tamoxifen use.  She had been evaluated for pelvic pain with a pelvic  ultrasound to evaluate the adnexa.  She was incidentally noted to have an  apparent endometrial mass.  Subsequent cinema fusion ultrasound was also  suggestive of endometrial mass.  The patient presents for hysteroscopy for  surgical diagnosis and potential treatment.   DESCRIPTION OF PROCEDURE:  The patient was taken to the operating room and  LMA general obtained.  She was placed in the ski position and prepped and  draped in standard fashion.  Bladder emptied with a red rubber catheter.  Examination under anesthesia showed a mid plane normal size uterus, no  adnexal masses noted.   Speculum inserted.  Single-tooth attached.  Cervix dilated to #23.  Diagnostic hysteroscope inserted after being flushed with Sorbitol.  Good  uterine distention occurred.  Endometrial cavity examined and no focal  lesions  seen.  The endometrium was then gently curetted and specimen  submitted to pathology.   Instrument were removed. Cervix hemostatis.  The patient tolerated the  procedure well and there were no complications.  She was taken to the  recovery room awake, alert and in stable condition.                                               Gerri Spore B. Earlene Plater, M.D.    WBD/MEDQ  D:  09/13/2002  T:  09/13/2002  Job:  [id number]

## 2010-11-19 ENCOUNTER — Other Ambulatory Visit: Payer: Self-pay | Admitting: Family Medicine

## 2010-11-19 DIAGNOSIS — Z1231 Encounter for screening mammogram for malignant neoplasm of breast: Secondary | ICD-10-CM

## 2010-12-21 ENCOUNTER — Ambulatory Visit
Admission: RE | Admit: 2010-12-21 | Discharge: 2010-12-21 | Disposition: A | Discharge status: Home / Self Care | Payer: MEDICARE | Source: Ambulatory Visit | Attending: Family Medicine | Admitting: Family Medicine

## 2010-12-21 DIAGNOSIS — Z1231 Encounter for screening mammogram for malignant neoplasm of breast: Secondary | ICD-10-CM

## 2011-04-06 ENCOUNTER — Telehealth (INDEPENDENT_AMBULATORY_CARE_PROVIDER_SITE_OTHER): Payer: Self-pay | Admitting: General Surgery

## 2011-04-08 NOTE — Telephone Encounter (Signed)
Pt scheduled to see dr. Carolynne Edouard on 04-15-11/gy

## 2011-04-15 ENCOUNTER — Encounter (INDEPENDENT_AMBULATORY_CARE_PROVIDER_SITE_OTHER): Payer: Self-pay | Admitting: General Surgery

## 2011-04-15 ENCOUNTER — Ambulatory Visit (INDEPENDENT_AMBULATORY_CARE_PROVIDER_SITE_OTHER): Payer: MEDICARE | Admitting: General Surgery

## 2011-04-15 VITALS — BP 126/76 | HR 65 | Temp 98.0°F | Ht 62.5 in | Wt 126.8 lb

## 2011-04-15 DIAGNOSIS — Z853 Personal history of malignant neoplasm of breast: Secondary | ICD-10-CM

## 2011-04-15 NOTE — Patient Instructions (Signed)
Continue regular self exams  

## 2011-04-26 ENCOUNTER — Other Ambulatory Visit: Payer: MEDICARE | Admitting: Lab

## 2011-04-26 ENCOUNTER — Ambulatory Visit (HOSPITAL_BASED_OUTPATIENT_CLINIC_OR_DEPARTMENT_OTHER): Payer: MEDICARE | Admitting: Oncology

## 2011-04-26 VITALS — BP 136/74 | HR 58 | Temp 97.7°F | Ht 62.5 in | Wt 128.6 lb

## 2011-04-26 DIAGNOSIS — Z853 Personal history of malignant neoplasm of breast: Secondary | ICD-10-CM

## 2011-04-26 DIAGNOSIS — Z09 Encounter for follow-up examination after completed treatment for conditions other than malignant neoplasm: Secondary | ICD-10-CM

## 2011-04-26 LAB — COMPREHENSIVE METABOLIC PANEL
ALT: 10 U/L (ref 0–35)
AST: 13 U/L (ref 0–37)
Albumin: 4 g/dL (ref 3.5–5.2)
Alkaline Phosphatase: 47 U/L (ref 39–117)
Potassium: 4.1 mEq/L (ref 3.5–5.3)
Sodium: 140 mEq/L (ref 135–145)
Total Bilirubin: 0.3 mg/dL (ref 0.3–1.2)
Total Protein: 6.2 g/dL (ref 6.0–8.3)

## 2011-04-26 LAB — CBC WITH DIFFERENTIAL/PLATELET
BASO%: 0.4 % (ref 0.0–2.0)
LYMPH%: 25.8 % (ref 14.0–49.7)
MCHC: 33.7 g/dL (ref 31.5–36.0)
MCV: 87.9 fL (ref 79.5–101.0)
MONO%: 6.8 % (ref 0.0–14.0)
NEUT#: 4.4 10*3/uL (ref 1.5–6.5)
Platelets: 197 10*3/uL (ref 145–400)
RBC: 4.18 10*6/uL (ref 3.70–5.45)
RDW: 12.8 % (ref 11.2–14.5)
WBC: 6.9 10*3/uL (ref 3.9–10.3)

## 2011-04-26 NOTE — Progress Notes (Signed)
ID: Leah Hall  DOB: Feb 06, 1941  MR#: [id number]  CSN#: [id number]   Interval History:   Leah Hall returns for followup of her breast cancer. Interval history is generally unremarkable. She continues to be most concerned regarding her husband, Leah Hall, who has had a whole series of operations and as she puts it spent 9 of the last 12 months in and out of the hospital.  ROS:  She herself is doing well. She's developed a small "bump" in her axilla on the right. She brought this to Dr. Billey Chang attention and he felt it was a small cyst. It isn't bothering her particularly right now. She is a bit anxious and she says depressed mostly because of Leah Hall, but otherwise a detailed review of systems is entirely stable.  Allergies  Allergen Reactions  . Actos (Pioglitazone Hydrochloride) Nausea And Vomiting    servere  . Codeine Nausea And Vomiting    severe  . Darvocet (Propoxyphene N-Acetaminophen) Nausea And Vomiting    severe  . Diovan Hct (Valsartan-Hydrochlorothiazide) Nausea And Vomiting    severe  . Lisinopril-Hydrochlorothiazide Nausea And Vomiting    severe    Current Outpatient Prescriptions  Medication Sig Dispense Refill  . ALPRAZolam (XANAX) 0.5 MG tablet Take 0.5 mg by mouth 3 (three) times daily as needed.      . cloNIDine (CATAPRES) 0.1 MG tablet       . hydrochlorothiazide (HYDRODIURIL) 25 MG tablet       . rosuvastatin (CRESTOR) 5 MG tablet Take 2.5 mg by mouth daily.      . traZODone (DESYREL) 50 MG tablet          Objective:  Filed Vitals:   04/26/11 1352  BP: 136/74  Pulse: 58  Temp: 97.7 F (36.5 C)    BMI: Body mass index is 23.15 kg/(m^2).   ECOG FS: 1  Physical Exam:   Sclerae unicteric  Oropharynx clear  No peripheral adenopathy; the "bump" in the right axilla looks most likely a small blackhead, measuring about 2 mm in diameter, with a small black spot at the apex; this is superficial and there is no suggestion of right axillary adenopathy.  Lungs clear --  no rales or rhonchi  Heart regular rate and rhythm  Abdomen benign  MSK no focal spinal tenderness, no peripheral edema  Neuro nonfocal  Breast exam: Right breast no suspicious masses, status post prior benign biopsy. Left breast status post lumpectomy; no evidence of disease recurrence  Lab Results:      Chemistry      Component Value Date/Time   NA 139 04/23/2010 1040   K 3.3* 04/23/2010 1040   CL 101 04/23/2010 1040   CO2 30 04/23/2010 1040   BUN 11 04/23/2010 1040   CREATININE 0.76 04/23/2010 1040      Component Value Date/Time   CALCIUM 9.7 04/23/2010 1040   ALKPHOS 47 04/23/2010 1040   AST 17 04/23/2010 1040   ALT 13 04/23/2010 1040   BILITOT 0.9 04/23/2010 1040       Lab Results  Component Value Date   WBC 6.9 04/26/2011   HGB 12.4 04/26/2011   HCT 36.8 04/26/2011   MCV 87.9 04/26/2011   PLT 197 04/26/2011   NEUTROABS 4.4 04/26/2011    Studies/Results:  [facility] at [facility] October 2012 was unremarkable  Assessment: 71 year-old [place] woman status post left lumpectomy and sentinel lymph node dissection July of 2003 for an ER/PR positive, 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  She was on antiestrogens between July of 2003 and September of 2008.  She has been followed off treatment since.   Plan: She is now just about 10 years out from her original surgery him and we are releasing her to her primary care physician. She plans to continue to see Dr. Carolynne Edouard every 6 months and doubtless he will perform a yearly breast exams. She will need yearly mammography as well, which she usually obtains through the [facility] in October.     I have made no further appointments for Nicosha here, but of course I would be glad to see her at anytime in the future as needed  arises     Leah Hall C 04/26/2011

## 2011-05-03 ENCOUNTER — Encounter (INDEPENDENT_AMBULATORY_CARE_PROVIDER_SITE_OTHER): Payer: Self-pay | Admitting: General Surgery

## 2011-05-03 NOTE — Progress Notes (Signed)
Subjective:     Patient ID: Leah Hall, female   DOB: [birth year], 71 y.o.   MRN: [id number]  HPI The patient is an 71 year old white female who is now almost 10 years out from a left breast lumpectomy and negative sentinel node biopsy for T1 B. N0 left breast cancer. She finished Aromasin and radiation therapy. Her last mammogram was in October that showed no evidence of malignancy. Her last 3-4 weeks she feels as though she felt a lump in the right breast and some soreness in the axilla.  Review of Systems  Constitutional: Negative.   HENT: Negative.   Eyes: Negative.   Respiratory: Negative.   Cardiovascular: Negative.   Gastrointestinal: Negative.   Genitourinary: Negative.   Musculoskeletal: Negative.   Skin: Negative.   Neurological: Negative.   Hematological: Negative.   Psychiatric/Behavioral: Negative.        Objective:   Physical Exam  Constitutional: She is oriented to person, place, and time. She appears well-developed and well-nourished.  HENT:  Head: Normocephalic and atraumatic.  Eyes: Conjunctivae and EOM are normal. Pupils are equal, round, and reactive to light.  Neck: Normal range of motion. Neck supple.  Cardiovascular: Normal rate, regular rhythm and normal heart sounds.   Pulmonary/Chest: Effort normal and breath sounds normal.       No significant palpable mass in either breast. No axillary supraclavicular cervical lymphadenopathy  Abdominal: Soft. Bowel sounds are normal. She exhibits no mass. There is no tenderness.  Musculoskeletal: Normal range of motion.  Lymphadenopathy:    She has no cervical adenopathy.  Neurological: She is alert and oriented to person, place, and time.  Skin: Skin is warm and dry.  Psychiatric: She has a normal mood and affect. Her behavior is normal.       Assessment:     10 years status post left breast lumpectomy and negative sentinel node biopsy. Now with some tenderness of the right breast    Plan:     We will  plan to see her back again in another 6 months to check her progress.

## 2011-10-08 ENCOUNTER — Ambulatory Visit (INDEPENDENT_AMBULATORY_CARE_PROVIDER_SITE_OTHER): Payer: MEDICARE | Admitting: General Surgery

## 2011-10-08 ENCOUNTER — Encounter (INDEPENDENT_AMBULATORY_CARE_PROVIDER_SITE_OTHER): Payer: Self-pay | Admitting: General Surgery

## 2011-10-08 VITALS — BP 121/65 | HR 82 | Temp 97.1°F | Resp 14 | Ht 62.5 in | Wt 125.6 lb

## 2011-10-08 DIAGNOSIS — Z853 Personal history of malignant neoplasm of breast: Secondary | ICD-10-CM

## 2011-10-08 NOTE — Progress Notes (Signed)
Subjective:     Patient ID: Leah Hall, female   DOB: 1940/09/20, 71 y.o.   MRN: [id number]  HPI The patient is a 71 year old white female who is 10 years out from a left breast lumpectomy and negative sentinel node biopsy. We evaluated her several months ago for a possible mass in the right breast. Since her last visit she has had no problems. She denies any breast pain.  Review of Systems  Constitutional: Negative.   HENT: Negative.   Eyes: Negative.   Respiratory: Negative.   Cardiovascular: Negative.   Gastrointestinal: Negative.   Genitourinary: Negative.   Musculoskeletal: Negative.   Skin: Negative.   Neurological: Negative.   Hematological: Negative.   Psychiatric/Behavioral: Negative.        Objective:   Physical Exam  Constitutional: She is oriented to person, place, and time. She appears well-developed and well-nourished.  HENT:  Head: Normocephalic and atraumatic.  Eyes: Conjunctivae and EOM are normal. Pupils are equal, round, and reactive to light.  Neck: Normal range of motion. Neck supple.  Cardiovascular: Normal rate, regular rhythm and normal heart sounds.   Pulmonary/Chest: Effort normal and breath sounds normal.       The patient has some nodularity around her incision on the right breast which I believe is the edge of the lumpectomy cavity. Otherwise there is no palpable mass in either breast. No palpable axillary supraclavicular or cervical lymphadenopathy.  Abdominal: Soft. Bowel sounds are normal. She exhibits no mass. There is no tenderness.  Musculoskeletal: Normal range of motion.  Lymphadenopathy:    She has no cervical adenopathy.  Neurological: She is alert and oriented to person, place, and time.  Skin: Skin is warm and dry.  Psychiatric: She has a normal mood and affect. Her behavior is normal.       Assessment:     10 years status post left breast lumpectomy    Plan:     At this point she seems to be doing well. She will continue  to do regular self exams. We will reevaluate her in another 6 months.

## 2011-10-08 NOTE — Patient Instructions (Signed)
Continue regular self exams  

## 2011-10-12 ENCOUNTER — Encounter (INDEPENDENT_AMBULATORY_CARE_PROVIDER_SITE_OTHER): Payer: MEDICARE | Admitting: General Surgery

## 2011-11-12 ENCOUNTER — Ambulatory Visit (INDEPENDENT_AMBULATORY_CARE_PROVIDER_SITE_OTHER): Payer: MEDICARE | Admitting: General Surgery

## 2011-11-12 ENCOUNTER — Encounter (INDEPENDENT_AMBULATORY_CARE_PROVIDER_SITE_OTHER): Payer: MEDICARE | Admitting: General Surgery

## 2011-11-16 ENCOUNTER — Other Ambulatory Visit: Payer: Self-pay | Admitting: Family Medicine

## 2011-11-16 DIAGNOSIS — Z1231 Encounter for screening mammogram for malignant neoplasm of breast: Secondary | ICD-10-CM

## 2011-11-29 ENCOUNTER — Encounter: Payer: Self-pay | Admitting: Gastroenterology

## 2011-12-23 ENCOUNTER — Ambulatory Visit
Admission: RE | Admit: 2011-12-23 | Discharge: 2011-12-23 | Disposition: A | Discharge status: Home / Self Care | Payer: MEDICARE | Source: Ambulatory Visit | Attending: Family Medicine | Admitting: Family Medicine

## 2011-12-23 DIAGNOSIS — Z1231 Encounter for screening mammogram for malignant neoplasm of breast: Secondary | ICD-10-CM

## 2011-12-24 ENCOUNTER — Other Ambulatory Visit: Payer: Self-pay | Admitting: Family Medicine

## 2011-12-24 DIAGNOSIS — R928 Other abnormal and inconclusive findings on diagnostic imaging of breast: Secondary | ICD-10-CM

## 2011-12-28 ENCOUNTER — Ambulatory Visit
Admission: RE | Admit: 2011-12-28 | Discharge: 2011-12-28 | Disposition: A | Discharge status: Home / Self Care | Payer: MEDICARE | Source: Ambulatory Visit | Attending: Family Medicine | Admitting: Family Medicine

## 2011-12-28 DIAGNOSIS — R928 Other abnormal and inconclusive findings on diagnostic imaging of breast: Secondary | ICD-10-CM

## 2012-01-12 ENCOUNTER — Encounter (INDEPENDENT_AMBULATORY_CARE_PROVIDER_SITE_OTHER): Payer: Self-pay | Admitting: General Surgery

## 2012-02-17 ENCOUNTER — Encounter (INDEPENDENT_AMBULATORY_CARE_PROVIDER_SITE_OTHER): Payer: Self-pay | Admitting: General Surgery

## 2012-02-17 ENCOUNTER — Ambulatory Visit (INDEPENDENT_AMBULATORY_CARE_PROVIDER_SITE_OTHER): Payer: MEDICARE | Admitting: General Surgery

## 2012-02-17 VITALS — BP 142/60 | HR 60 | Temp 96.0°F | Resp 20 | Ht 62.5 in | Wt 121.4 lb

## 2012-02-17 DIAGNOSIS — Z853 Personal history of malignant neoplasm of breast: Secondary | ICD-10-CM

## 2012-02-17 NOTE — Progress Notes (Signed)
Subjective:     Patient ID: Leah Hall, female   DOB: Apr 09, 1940, 71 y.o.   MRN: [id number]  HPI The patient is a 71 year old white female who is 11 years status post left breast lumpectomy and negative sentinel node biopsy for a T1 B. N0 left breast cancer. She only complains of some back soreness which she attributes to a lot a heavy lifting during a recent move. She denies any breast pain. She denies any discharge from her nipple in either side. Her recent mammogram was significant only for a fibroadenoma that was stable  Review of Systems  Constitutional: Negative.   HENT: Negative.   Eyes: Negative.   Respiratory: Negative.   Cardiovascular: Negative.   Gastrointestinal: Negative.   Genitourinary: Negative.   Musculoskeletal: Negative.   Skin: Negative.   Neurological: Negative.   Hematological: Negative.   Psychiatric/Behavioral: Negative.        Objective:   Physical Exam  Constitutional: She is oriented to person, place, and time. She appears well-developed and well-nourished.  HENT:  Head: Normocephalic and atraumatic.  Eyes: Conjunctivae normal and EOM are normal. Pupils are equal, round, and reactive to light.  Neck: Normal range of motion. Neck supple.  Cardiovascular: Normal rate, regular rhythm and normal heart sounds.   Pulmonary/Chest: Effort normal and breath sounds normal.       She has some stable nodularity in both breasts near her incisions but otherwise no dominant palpable mass in either breast. There is no palpable axillary supraclavicular or cervical lymphadenopathy.  Abdominal: Soft. Bowel sounds are normal. She exhibits no mass. There is no tenderness.  Musculoskeletal: Normal range of motion.  Lymphadenopathy:    She has no cervical adenopathy.  Neurological: She is alert and oriented to person, place, and time.  Skin: Skin is warm and dry.  Psychiatric: She has a normal mood and affect. Her behavior is normal.       Assessment:     11  years status post left breast lumpectomy for breast cancer    Plan:     At this point she will continue to do regular self exams. We will plan to see her back in about 6 months. Her husband is also a patient of ours who has had some bleeding issues from his ostomy and I've encouraged her to bring him in and we'll check it out.

## 2012-02-17 NOTE — Patient Instructions (Signed)
Continue regular self exams  

## 2012-07-25 ENCOUNTER — Ambulatory Visit (INDEPENDENT_AMBULATORY_CARE_PROVIDER_SITE_OTHER): Payer: MEDICARE | Admitting: General Surgery

## 2012-07-25 ENCOUNTER — Encounter (INDEPENDENT_AMBULATORY_CARE_PROVIDER_SITE_OTHER): Payer: Self-pay | Admitting: General Surgery

## 2012-07-25 VITALS — BP 136/78 | HR 72 | Temp 97.7°F | Resp 14 | Ht 62.5 in | Wt 128.2 lb

## 2012-07-25 DIAGNOSIS — Z853 Personal history of malignant neoplasm of breast: Secondary | ICD-10-CM

## 2012-07-25 NOTE — Patient Instructions (Signed)
Continue regular self exams  

## 2012-07-25 NOTE — Progress Notes (Signed)
Subjective:     Patient ID: Leah Hall, female   DOB: 09/10/1940, 72 y.o.   MRN: [id number]  HPI The patient is a 71 year old white female who is 11-1/2 years status post left breast lumpectomy and negative sentinel node biopsy for a T1 B. N0 left breast cancer. Since her last visit she lost her husband who is also a patient of ours. She has some persistent soreness in the left breast which is a little bit better than it was right after surgery. Her next mammogram is due in October. She otherwise has no complaints.  Review of Systems  Constitutional: Negative.   HENT: Negative.   Eyes: Negative.   Respiratory: Negative.   Cardiovascular: Negative.   Gastrointestinal: Negative.   Endocrine: Negative.   Genitourinary: Negative.   Musculoskeletal: Negative.   Skin: Negative.   Allergic/Immunologic: Negative.   Neurological: Negative.   Hematological: Negative.   Psychiatric/Behavioral: Negative.        Objective:   Physical Exam  Constitutional: She is oriented to person, place, and time. She appears well-developed and well-nourished.  HENT:  Head: Normocephalic and atraumatic.  Eyes: Conjunctivae and EOM are normal. Pupils are equal, round, and reactive to light.  Neck: Normal range of motion. Neck supple.  Cardiovascular: Normal rate, regular rhythm and normal heart sounds.   Pulmonary/Chest: Effort normal and breath sounds normal.  There is some mild palpable nodularity at the incision on both breasts which is stable. Otherwise there is no palpable mass in either breast. There is no palpable axillary or supraclavicular cervical lymphadenopathy  Abdominal: Soft. Bowel sounds are normal. She exhibits no mass. There is no tenderness.  Musculoskeletal: Normal range of motion.  Lymphadenopathy:    She has no cervical adenopathy.  Neurological: She is alert and oriented to person, place, and time.  Skin: Skin is warm and dry.  Psychiatric: She has a normal mood and affect. Her  behavior is normal.       Assessment:     The patient is 11-1/2 years status post left lumpectomy for breast cancer     Plan:     At this point she will continue to do regular self exams. We will plan to see her back in about 6 months for a recheck.

## 2012-11-21 ENCOUNTER — Other Ambulatory Visit: Payer: Self-pay

## 2012-11-21 DIAGNOSIS — Z853 Personal history of malignant neoplasm of breast: Secondary | ICD-10-CM

## 2012-11-21 DIAGNOSIS — Z9889 Other specified postprocedural states: Secondary | ICD-10-CM

## 2012-11-21 DIAGNOSIS — Z1231 Encounter for screening mammogram for malignant neoplasm of breast: Secondary | ICD-10-CM

## 2012-12-25 ENCOUNTER — Ambulatory Visit
Admission: RE | Admit: 2012-12-25 | Discharge: 2012-12-25 | Disposition: A | Discharge status: Home / Self Care | Payer: MEDICARE | Source: Ambulatory Visit

## 2012-12-25 DIAGNOSIS — Z853 Personal history of malignant neoplasm of breast: Secondary | ICD-10-CM

## 2012-12-25 DIAGNOSIS — Z9889 Other specified postprocedural states: Secondary | ICD-10-CM

## 2012-12-25 DIAGNOSIS — Z1231 Encounter for screening mammogram for malignant neoplasm of breast: Secondary | ICD-10-CM

## 2013-11-20 ENCOUNTER — Other Ambulatory Visit: Payer: Self-pay

## 2013-11-20 DIAGNOSIS — Z1231 Encounter for screening mammogram for malignant neoplasm of breast: Secondary | ICD-10-CM

## 2013-11-20 DIAGNOSIS — Z853 Personal history of malignant neoplasm of breast: Secondary | ICD-10-CM

## 2013-12-26 ENCOUNTER — Ambulatory Visit
Admission: RE | Admit: 2013-12-26 | Discharge: 2013-12-26 | Disposition: A | Discharge status: Home / Self Care | Payer: MEDICARE | Source: Ambulatory Visit

## 2013-12-26 ENCOUNTER — Encounter (INDEPENDENT_AMBULATORY_CARE_PROVIDER_SITE_OTHER): Payer: Self-pay

## 2013-12-26 DIAGNOSIS — Z1231 Encounter for screening mammogram for malignant neoplasm of breast: Secondary | ICD-10-CM

## 2013-12-26 DIAGNOSIS — Z853 Personal history of malignant neoplasm of breast: Secondary | ICD-10-CM

## 2014-03-26 ENCOUNTER — Encounter: Payer: Self-pay | Admitting: Gastroenterology

## 2014-11-19 ENCOUNTER — Other Ambulatory Visit: Payer: Self-pay

## 2014-11-19 DIAGNOSIS — Z1231 Encounter for screening mammogram for malignant neoplasm of breast: Secondary | ICD-10-CM

## 2014-12-30 ENCOUNTER — Ambulatory Visit
Admission: RE | Admit: 2014-12-30 | Discharge: 2014-12-30 | Disposition: A | Discharge status: Home / Self Care | Payer: MEDICARE | Source: Ambulatory Visit

## 2014-12-30 DIAGNOSIS — Z1231 Encounter for screening mammogram for malignant neoplasm of breast: Secondary | ICD-10-CM

## 2015-01-09 ENCOUNTER — Ambulatory Visit
Admission: RE | Admit: 2015-01-09 | Discharge: 2015-01-09 | Disposition: A | Discharge status: Home / Self Care | Payer: MEDICARE | Source: Ambulatory Visit | Attending: Family Medicine | Admitting: Family Medicine

## 2015-01-09 ENCOUNTER — Other Ambulatory Visit: Payer: Self-pay | Admitting: Family Medicine

## 2015-01-09 DIAGNOSIS — R928 Other abnormal and inconclusive findings on diagnostic imaging of breast: Secondary | ICD-10-CM

## 2015-01-10 ENCOUNTER — Ambulatory Visit
Admission: RE | Admit: 2015-01-10 | Discharge: 2015-01-10 | Disposition: A | Discharge status: Home / Self Care | Payer: MEDICARE | Source: Ambulatory Visit | Attending: Family Medicine | Admitting: Family Medicine

## 2015-01-10 ENCOUNTER — Other Ambulatory Visit: Payer: Self-pay | Admitting: Family Medicine

## 2015-01-10 DIAGNOSIS — R928 Other abnormal and inconclusive findings on diagnostic imaging of breast: Secondary | ICD-10-CM

## 2015-01-13 ENCOUNTER — Other Ambulatory Visit: Payer: Self-pay | Admitting: Family Medicine

## 2015-01-13 DIAGNOSIS — D241 Benign neoplasm of right breast: Secondary | ICD-10-CM

## 2015-01-14 ENCOUNTER — Ambulatory Visit
Admission: RE | Admit: 2015-01-14 | Discharge: 2015-01-14 | Disposition: A | Discharge status: Home / Self Care | Payer: MEDICARE | Source: Ambulatory Visit | Attending: Family Medicine | Admitting: Family Medicine

## 2015-01-14 DIAGNOSIS — D241 Benign neoplasm of right breast: Secondary | ICD-10-CM

## 2015-01-31 ENCOUNTER — Other Ambulatory Visit: Payer: Self-pay | Admitting: General Surgery

## 2015-01-31 DIAGNOSIS — D241 Benign neoplasm of right breast: Secondary | ICD-10-CM

## 2015-02-17 ENCOUNTER — Other Ambulatory Visit: Payer: Self-pay | Admitting: General Surgery

## 2015-02-17 DIAGNOSIS — D241 Benign neoplasm of right breast: Secondary | ICD-10-CM

## 2015-02-19 ENCOUNTER — Other Ambulatory Visit: Payer: Self-pay

## 2015-02-19 ENCOUNTER — Encounter (HOSPITAL_BASED_OUTPATIENT_CLINIC_OR_DEPARTMENT_OTHER)
Admission: RE | Admit: 2015-02-19 | Discharge: 2015-02-19 | Disposition: A | Discharge status: Home / Self Care | Payer: MEDICARE | Source: Ambulatory Visit | Attending: General Surgery | Admitting: General Surgery

## 2015-02-19 ENCOUNTER — Encounter (HOSPITAL_BASED_OUTPATIENT_CLINIC_OR_DEPARTMENT_OTHER): Payer: Self-pay | Admitting: *Deleted

## 2015-02-19 DIAGNOSIS — Z01818 Encounter for other preprocedural examination: Secondary | ICD-10-CM | POA: Insufficient documentation

## 2015-02-19 DIAGNOSIS — D241 Benign neoplasm of right breast: Secondary | ICD-10-CM | POA: Diagnosis not present

## 2015-02-19 LAB — BASIC METABOLIC PANEL
ANION GAP: 8 (ref 5–15)
BUN: 11 mg/dL (ref 6–20)
CALCIUM: 9.9 mg/dL (ref 8.9–10.3)
CO2: 28 mmol/L (ref 22–32)
Chloride: 104 mmol/L (ref 101–111)
Creatinine, Ser: 0.54 mg/dL (ref 0.44–1.00)
GFR calc Af Amer: >60 mL/min (ref >60)
GLUCOSE: 113 mg/dL — AB (ref 65–99)
Potassium: 4.2 mmol/L (ref 3.5–5.1)
SODIUM: 140 mmol/L (ref 135–145)

## 2015-02-24 ENCOUNTER — Ambulatory Visit
Admission: RE | Admit: 2015-02-24 | Discharge: 2015-02-24 | Disposition: A | Discharge status: Home / Self Care | Payer: MEDICARE | Source: Ambulatory Visit | Attending: General Surgery | Admitting: General Surgery

## 2015-02-24 DIAGNOSIS — D241 Benign neoplasm of right breast: Secondary | ICD-10-CM

## 2015-02-25 ENCOUNTER — Ambulatory Visit (HOSPITAL_BASED_OUTPATIENT_CLINIC_OR_DEPARTMENT_OTHER)
Admission: RE | Admit: 2015-02-25 | Discharge: 2015-02-25 | Disposition: A | Discharge status: Home / Self Care | Payer: MEDICARE | Source: Ambulatory Visit | Attending: General Surgery | Admitting: General Surgery

## 2015-02-25 ENCOUNTER — Ambulatory Visit (HOSPITAL_BASED_OUTPATIENT_CLINIC_OR_DEPARTMENT_OTHER): Payer: MEDICARE | Admitting: Anesthesiology

## 2015-02-25 ENCOUNTER — Ambulatory Visit
Admission: RE | Admit: 2015-02-25 | Discharge: 2015-02-25 | Disposition: A | Discharge status: Home / Self Care | Payer: MEDICARE | Source: Ambulatory Visit | Attending: General Surgery | Admitting: General Surgery

## 2015-02-25 ENCOUNTER — Encounter (HOSPITAL_BASED_OUTPATIENT_CLINIC_OR_DEPARTMENT_OTHER): Payer: Self-pay | Admitting: *Deleted

## 2015-02-25 ENCOUNTER — Encounter (HOSPITAL_BASED_OUTPATIENT_CLINIC_OR_DEPARTMENT_OTHER)
Admission: RE | Disposition: A | Discharge status: Home / Self Care | Payer: Self-pay | Source: Ambulatory Visit | Attending: General Surgery

## 2015-02-25 DIAGNOSIS — Z87891 Personal history of nicotine dependence: Secondary | ICD-10-CM | POA: Insufficient documentation

## 2015-02-25 DIAGNOSIS — Z79899 Other long term (current) drug therapy: Secondary | ICD-10-CM | POA: Diagnosis not present

## 2015-02-25 DIAGNOSIS — I10 Essential (primary) hypertension: Secondary | ICD-10-CM | POA: Insufficient documentation

## 2015-02-25 DIAGNOSIS — D241 Benign neoplasm of right breast: Secondary | ICD-10-CM | POA: Diagnosis not present

## 2015-02-25 DIAGNOSIS — Z853 Personal history of malignant neoplasm of breast: Secondary | ICD-10-CM | POA: Diagnosis not present

## 2015-02-25 HISTORY — DX: Nausea with vomiting, unspecified: R11.2

## 2015-02-25 HISTORY — DX: Headache: R51

## 2015-02-25 HISTORY — DX: Irritable bowel syndrome, unspecified: K58.9

## 2015-02-25 HISTORY — DX: Headache, unspecified: R51.9

## 2015-02-25 HISTORY — DX: Unspecified osteoarthritis, unspecified site: M19.90

## 2015-02-25 HISTORY — DX: Anxiety disorder, unspecified: F41.9

## 2015-02-25 HISTORY — DX: Presence of dental prosthetic device (complete) (partial): Z97.2

## 2015-02-25 HISTORY — PX: BREAST LUMPECTOMY WITH RADIOACTIVE SEED LOCALIZATION: SHX6424

## 2015-02-25 HISTORY — DX: Other specified postprocedural states: Z98.890

## 2015-02-25 SURGERY — BREAST LUMPECTOMY WITH RADIOACTIVE SEED LOCALIZATION
Anesthesia: General | Site: Breast | Laterality: Right

## 2015-02-25 MED ORDER — MIDAZOLAM HCL 2 MG/2ML IJ SOLN: 1.0000 mg | INTRAMUSCULAR | Status: DC | PRN

## 2015-02-25 MED ORDER — ONDANSETRON HCL 4 MG/2ML IJ SOLN
INTRAMUSCULAR | Status: DC | PRN
  Administered 2015-02-25: 4 mg via INTRAVENOUS

## 2015-02-25 MED ORDER — SCOPOLAMINE 1 MG/3DAYS TD PT72
MEDICATED_PATCH | TRANSDERMAL | Status: AC
  Filled 2015-02-25: qty 1

## 2015-02-25 MED ORDER — HEPARIN SOD (PORK) LOCK FLUSH 100 UNIT/ML IV SOLN
INTRAVENOUS | Status: AC
  Filled 2015-02-25: qty 5

## 2015-02-25 MED ORDER — LIDOCAINE HCL (CARDIAC) 20 MG/ML IV SOLN
INTRAVENOUS | Status: DC | PRN
  Administered 2015-02-25: 60 mg via INTRAVENOUS

## 2015-02-25 MED ORDER — BUPIVACAINE HCL (PF) 0.25 % IJ SOLN
INTRAMUSCULAR | Status: AC
  Filled 2015-02-25: qty 30

## 2015-02-25 MED ORDER — PROPOFOL 10 MG/ML IV BOLUS
INTRAVENOUS | Status: AC
  Filled 2015-02-25: qty 20

## 2015-02-25 MED ORDER — PROPOFOL 10 MG/ML IV BOLUS
INTRAVENOUS | Status: DC | PRN
  Administered 2015-02-25: 150 mg via INTRAVENOUS

## 2015-02-25 MED ORDER — ONDANSETRON HCL 4 MG/2ML IJ SOLN
INTRAMUSCULAR | Status: AC
  Filled 2015-02-25: qty 2

## 2015-02-25 MED ORDER — HEPARIN (PORCINE) IN NACL 2-0.9 UNIT/ML-% IJ SOLN
INTRAMUSCULAR | Status: AC
  Filled 2015-02-25: qty 500

## 2015-02-25 MED ORDER — DEXAMETHASONE SODIUM PHOSPHATE 10 MG/ML IJ SOLN
INTRAMUSCULAR | Status: AC
  Filled 2015-02-25: qty 1

## 2015-02-25 MED ORDER — BUPIVACAINE-EPINEPHRINE (PF) 0.25% -1:200000 IJ SOLN
INTRAMUSCULAR | Status: DC | PRN
  Administered 2015-02-25: 7 mL

## 2015-02-25 MED ORDER — BUPIVACAINE-EPINEPHRINE (PF) 0.25% -1:200000 IJ SOLN
INTRAMUSCULAR | Status: AC
  Filled 2015-02-25: qty 30

## 2015-02-25 MED ORDER — FENTANYL CITRATE (PF) 100 MCG/2ML IJ SOLN
50.0000 ug | INTRAMUSCULAR | Status: DC | PRN
  Administered 2015-02-25 (×2): 50 ug via INTRAVENOUS

## 2015-02-25 MED ORDER — HYDROCODONE-ACETAMINOPHEN 5-325 MG PO TABS: 1.0000 | ORAL_TABLET | ORAL | Status: AC | PRN

## 2015-02-25 MED ORDER — HYDROCODONE-ACETAMINOPHEN 7.5-325 MG PO TABS: 1.0000 | ORAL_TABLET | Freq: Once | ORAL | Status: DC | PRN

## 2015-02-25 MED ORDER — PROMETHAZINE HCL 25 MG/ML IJ SOLN
6.2500 mg | INTRAMUSCULAR | Status: DC | PRN
  Administered 2015-02-25: 6.25 mg via INTRAVENOUS

## 2015-02-25 MED ORDER — DEXAMETHASONE SODIUM PHOSPHATE 4 MG/ML IJ SOLN
INTRAMUSCULAR | Status: DC | PRN
  Administered 2015-02-25: 10 mg via INTRAVENOUS

## 2015-02-25 MED ORDER — PROMETHAZINE HCL 25 MG/ML IJ SOLN
INTRAMUSCULAR | Status: AC
  Filled 2015-02-25: qty 1

## 2015-02-25 MED ORDER — LIDOCAINE HCL (PF) 1 % IJ SOLN
INTRAMUSCULAR | Status: AC
  Filled 2015-02-25: qty 30

## 2015-02-25 MED ORDER — FENTANYL CITRATE (PF) 100 MCG/2ML IJ SOLN
INTRAMUSCULAR | Status: AC
  Filled 2015-02-25: qty 2

## 2015-02-25 MED ORDER — CEFAZOLIN SODIUM-DEXTROSE 2-3 GM-% IV SOLR
INTRAVENOUS | Status: AC
  Filled 2015-02-25: qty 50

## 2015-02-25 MED ORDER — LACTATED RINGERS IV SOLN
INTRAVENOUS | Status: DC
  Administered 2015-02-25 (×2): via INTRAVENOUS

## 2015-02-25 MED ORDER — FENTANYL CITRATE (PF) 100 MCG/2ML IJ SOLN
25.0000 ug | INTRAMUSCULAR | Status: DC | PRN
  Administered 2015-02-25: 50 ug via INTRAVENOUS
  Administered 2015-02-25: 25 ug via INTRAVENOUS

## 2015-02-25 MED ORDER — LIDOCAINE HCL (CARDIAC) 20 MG/ML IV SOLN
INTRAVENOUS | Status: AC
  Filled 2015-02-25: qty 5

## 2015-02-25 MED ORDER — CEFAZOLIN SODIUM-DEXTROSE 2-3 GM-% IV SOLR
2.0000 g | INTRAVENOUS | Status: AC
  Administered 2015-02-25: 2 g via INTRAVENOUS

## 2015-02-25 MED ORDER — GLYCOPYRROLATE 0.2 MG/ML IJ SOLN: 0.2000 mg | Freq: Once | INTRAMUSCULAR | Status: DC | PRN

## 2015-02-25 MED ORDER — SCOPOLAMINE 1 MG/3DAYS TD PT72: 1.0000 | MEDICATED_PATCH | Freq: Once | TRANSDERMAL | Status: DC

## 2015-02-25 SURGICAL SUPPLY — 52 items
APPLIER CLIP 9.375 MED OPEN (MISCELLANEOUS)
BINDER BREAST LRG (GAUZE/BANDAGES/DRESSINGS) IMPLANT
BINDER BREAST MEDIUM (GAUZE/BANDAGES/DRESSINGS) ×3 IMPLANT
BINDER BREAST XLRG (GAUZE/BANDAGES/DRESSINGS) IMPLANT
BINDER BREAST XXLRG (GAUZE/BANDAGES/DRESSINGS) IMPLANT
BLADE SURG 15 STRL LF DISP TIS (BLADE) ×1 IMPLANT
BLADE SURG 15 STRL SS (BLADE) ×2
CANISTER SUC SOCK COL 7IN (MISCELLANEOUS) IMPLANT
CANISTER SUCT 1200ML W/VALVE (MISCELLANEOUS) IMPLANT
CHLORAPREP W/TINT 26ML (MISCELLANEOUS) ×3 IMPLANT
CLIP APPLIE 9.375 MED OPEN (MISCELLANEOUS) IMPLANT
CLIP TI WIDE RED SMALL 6 (CLIP) ×3 IMPLANT
COVER BACK TABLE 60X90IN (DRAPES) ×3 IMPLANT
COVER MAYO STAND STRL (DRAPES) ×3 IMPLANT
COVER PROBE W GEL 5X96 (DRAPES) ×3 IMPLANT
DECANTER SPIKE VIAL GLASS SM (MISCELLANEOUS) IMPLANT
DEVICE DUBIN W/COMP PLATE 8390 (MISCELLANEOUS) ×3 IMPLANT
DRAPE LAPAROSCOPIC ABDOMINAL (DRAPES) ×3 IMPLANT
DRAPE UTILITY XL STRL (DRAPES) ×3 IMPLANT
ELECT COATED BLADE 2.86 ST (ELECTRODE) ×3 IMPLANT
ELECT REM PT RETURN 9FT ADLT (ELECTROSURGICAL) ×3
ELECTRODE REM PT RTRN 9FT ADLT (ELECTROSURGICAL) ×1 IMPLANT
GLOVE BIO SURGEON STRL SZ 6.5 (GLOVE) ×2 IMPLANT
GLOVE BIO SURGEONS STRL SZ 6.5 (GLOVE) ×1
GLOVE BIOGEL PI IND STRL 7.0 (GLOVE) ×1 IMPLANT
GLOVE BIOGEL PI IND STRL 8 (GLOVE) ×1 IMPLANT
GLOVE BIOGEL PI INDICATOR 7.0 (GLOVE) ×2
GLOVE BIOGEL PI INDICATOR 8 (GLOVE) ×2
GLOVE ECLIPSE 6.5 STRL STRAW (GLOVE) ×3 IMPLANT
GLOVE ECLIPSE 7.5 STRL STRAW (GLOVE) ×3 IMPLANT
GOWN STRL REUS W/ TWL LRG LVL3 (GOWN DISPOSABLE) ×1 IMPLANT
GOWN STRL REUS W/ TWL XL LVL3 (GOWN DISPOSABLE) ×1 IMPLANT
GOWN STRL REUS W/TWL LRG LVL3 (GOWN DISPOSABLE) ×2
GOWN STRL REUS W/TWL XL LVL3 (GOWN DISPOSABLE) ×2
ILLUMINATOR WAVEGUIDE N/F (MISCELLANEOUS) IMPLANT
KIT MARKER MARGIN INK (KITS) ×3 IMPLANT
LIGHT WAVEGUIDE WIDE FLAT (MISCELLANEOUS) ×3 IMPLANT
LIQUID BAND (GAUZE/BANDAGES/DRESSINGS) ×3 IMPLANT
NEEDLE HYPO 25X1 1.5 SAFETY (NEEDLE) ×3 IMPLANT
NS IRRIG 1000ML POUR BTL (IV SOLUTION) IMPLANT
PACK BASIN DAY SURGERY FS (CUSTOM PROCEDURE TRAY) ×3 IMPLANT
PENCIL BUTTON HOLSTER BLD 10FT (ELECTRODE) ×3 IMPLANT
SLEEVE SCD COMPRESS KNEE MED (MISCELLANEOUS) ×3 IMPLANT
SPONGE LAP 4X18 X RAY DECT (DISPOSABLE) ×3 IMPLANT
SUT MON AB 5-0 PS2 18 (SUTURE) ×3 IMPLANT
SUT VICRYL 3-0 CR8 SH (SUTURE) ×3 IMPLANT
SYR CONTROL 10ML LL (SYRINGE) ×3 IMPLANT
TOWEL OR 17X24 6PK STRL BLUE (TOWEL DISPOSABLE) ×3 IMPLANT
TOWEL OR NON WOVEN STRL DISP B (DISPOSABLE) ×3 IMPLANT
TUBE CONNECTING 20'X1/4 (TUBING)
TUBE CONNECTING 20X1/4 (TUBING) IMPLANT
YANKAUER SUCT BULB TIP NO VENT (SUCTIONS) IMPLANT

## 2015-02-25 NOTE — Op Note (Signed)
Preoperative Diagnosis: right breast papilloma  Postoprative Diagnosis: right breast papilloma  Procedure: Procedure(s): RIGHT BREAST LUMPECTOMY WITH RADIOACTIVE SEED LOCALIZATION   Surgeon: Excell Seltzer T   Assistants: None  Anesthesia:  General LMA anesthesia  Indications: patient is a 74 year old female with a personal history of left breast cancer. She had a recent abnormal mammogram. Core biopsy revealing intraductal papilloma. We previously discussed options and nature of surgery and risks detailed elsewhere and I elected to proceed with radioactive seed localized lumpectomy.    Procedure Detail:  Patient had previously undergone accurate placement of a radioactive seed at the site of the marking clip in the lateral right breast. Seed placement was confirmed preoperatively with the neoprobe in the holding area. Patient was then brought to the operating room, placed in the supine position on the operating table, and laryngeal mask general anesthesia induced. The right breast was widely sterilely prepped and draped. She received preoperative IV antibiotics. Patient timeout was performed and correct procedure verified. The neoprobe was used to localize the area of the seed at the 9:00 position of the right breast. A circumareolar incision was made and skin and subcutaneous flap raised laterally over the area of high counts. There was no palpable mass. Using the neoprobe for guidance and approximately 1-1.5 cm globular specimen of breast tissue was excised around the seed. The specimen was removed and inked for margins. Specimen mammogram showed the radioactive seed and marking clip centrally located in the specimen. Hemostasis was assured in the lumpectomy cavity which was marked with small clips. The deep subcutaneous tissue was closed with interrupted 3-0 Vicryl and skin with subcuticular 5-0 Monocryl and Dermabond. Sponge needle and instrument counts were correct.    Findings: As  above  Estimated Blood Loss:  Minimal         Drains: none  Blood Given: none          Specimens: right breast lumpectomy        Complications:  * No complications entered in OR log *         Disposition: PACU - hemodynamically stable.         Condition: stable

## 2015-02-25 NOTE — Anesthesia Postprocedure Evaluation (Signed)
Anesthesia Post Note  Patient: Leah Hall  Procedure(s) Performed: Procedure(s) (LRB): RIGHT BREAST LUMPECTOMY WITH RADIOACTIVE SEED LOCALIZATION (Right)  Patient location during evaluation: PACU Anesthesia Type: General Level of consciousness: awake and alert Pain management: pain level controlled Vital Signs Assessment: post-procedure vital signs reviewed and stable Respiratory status: spontaneous breathing and respiratory function stable Cardiovascular status: blood pressure returned to baseline and stable Postop Assessment: no signs of nausea or vomiting Anesthetic complications: no    Last Vitals:  Filed Vitals:   02/25/15 1400 02/25/15 1415  BP: 151/75 168/87  Pulse: 67 64  Temp:    Resp: 13 15    Last Pain:  Filed Vitals:   02/25/15 1420  PainSc: 7                  Janai Maudlin S

## 2015-02-25 NOTE — Discharge Instructions (Signed)
[facility] °Office Phone Number [phone] ° °BREAST BIOPSY/ PARTIAL MASTECTOMY: POST OP INSTRUCTIONS ° °Always review your discharge instruction sheet given to you by the facility where your surgery was performed. ° °IF YOU HAVE DISABILITY OR FAMILY LEAVE FORMS, YOU MUST BRING THEM TO THE OFFICE FOR PROCESSING.  DO NOT GIVE THEM TO YOUR DOCTOR. ° °1. A prescription for pain medication may be given to you upon discharge.  Take your pain medication as prescribed, if needed.  If narcotic pain medicine is not needed, then you may take acetaminophen (Tylenol) or ibuprofen (Advil) as needed. °2. Take your usually prescribed medications unless otherwise directed °3. If you need a refill on your pain medication, please contact your pharmacy.  They will contact our office to request authorization.  Prescriptions will not be filled after 5pm or on week-ends. °4. You should eat very light the first 24 hours after surgery, such as soup, crackers, pudding, etc.  Resume your normal diet the day after surgery. °5. Most patients will experience some swelling and bruising in the breast.  Ice packs and a good support bra will help.  Swelling and bruising can take several days to resolve.  °6. It is common to experience some constipation if taking pain medication after surgery.  Increasing fluid intake and taking a stool softener will usually help or prevent this problem from occurring.  A mild laxative (Milk of Magnesia or Miralax) should be taken according to package directions if there are no bowel movements after 48 hours. °7. Unless discharge instructions indicate otherwise, you may remove your bandages 24-48 hours after surgery, and you may shower at that time.  You may have steri-strips (small skin tapes) in place directly over the incision.  These strips should be left on the skin for 7-10 days.  If your surgeon used skin glue on the incision, you may shower in 24 hours.  The glue will flake off over the  next 2-3 weeks.  Any sutures or staples will be removed at the office during your follow-up visit. °8. ACTIVITIES:  You may resume regular daily activities (gradually increasing) beginning the next day.  Wearing a good support bra or sports bra minimizes pain and swelling.  You may have sexual intercourse when it is comfortable. °a. You may drive when you no longer are taking prescription pain medication, you can comfortably wear a seatbelt, and you can safely maneuver your car and apply brakes. °b. RETURN TO WORK:  ______________________________________________________________________________________ °9. You should see your doctor in the office for a follow-up appointment approximately two weeks after your surgery.  Your doctor’s nurse will typically make your follow-up appointment when she calls you with your pathology report.  Expect your pathology report 2-3 business days after your surgery.  You may call to check if you do not hear from us after three days. °10. OTHER INSTRUCTIONS: _______________________________________________________________________________________________ _____________________________________________________________________________________________________________________________________ °_____________________________________________________________________________________________________________________________________ °_____________________________________________________________________________________________________________________________________ ° °WHEN TO CALL YOUR DOCTOR: °1. Fever over 101.0 °2. Nausea and/or vomiting. °3. Extreme swelling or bruising. °4. Continued bleeding from incision. °5. Increased pain, redness, or drainage from the incision. ° °The clinic staff is available to answer your questions during regular business hours.  Please don’t hesitate to call and ask to speak to one of the nurses for clinical concerns.  If you have a medical emergency, go to the nearest  emergency room or call 911.  A surgeon from [facility] is always on call at the hospital. ° °For further questions, please visit [email or web address]  ° ° ° °  Post Anesthesia Home Care Instructions ° °Activity: °Get plenty of rest for the remainder of the day. A responsible adult should stay with you for 24 hours following the procedure.  °For the next 24 hours, DO NOT: °-Drive a car °-Operate machinery °-Drink alcoholic beverages °-Take any medication unless instructed by your physician °-Make any legal decisions or sign important papers. ° °Meals: °Start with liquid foods such as gelatin or soup. Progress to regular foods as tolerated. Avoid greasy, spicy, heavy foods. If nausea and/or vomiting occur, drink only clear liquids until the nausea and/or vomiting subsides. Call your physician if vomiting continues. ° °Special Instructions/Symptoms: °Your throat may feel dry or sore from the anesthesia or the breathing tube placed in your throat during surgery. If this causes discomfort, gargle with warm salt water. The discomfort should disappear within 24 hours. ° °If you had a scopolamine patch placed behind your ear for the management of post- operative nausea and/or vomiting: ° °1. The medication in the patch is effective for 72 hours, after which it should be removed.  Wrap patch in a tissue and discard in the trash. Wash hands thoroughly with soap and water. °2. You may remove the patch earlier than 72 hours if you experience unpleasant side effects which may include dry mouth, dizziness or visual disturbances. °3. Avoid touching the patch. Wash your hands with soap and water after contact with the patch. °  ° °

## 2015-02-25 NOTE — Transfer of Care (Signed)
Immediate Anesthesia Transfer of Care Note  Patient: Leah Hall  Procedure(s) Performed: Procedure(s): RIGHT BREAST LUMPECTOMY WITH RADIOACTIVE SEED LOCALIZATION (Right)  Patient Location: PACU  Anesthesia Type:General  Level of Consciousness: awake, sedated and patient cooperative  Airway & Oxygen Therapy: Patient Spontanous Breathing and Patient connected to face mask oxygen  Post-op Assessment: Report given to RN and Post -op Vital signs reviewed and stable  Post vital signs: Reviewed and stable  Last Vitals:  Filed Vitals:   02/25/15 1208  BP: 132/64  Pulse: 60  Temp: 36.4 C  Resp: 20    Complications: No apparent anesthesia complications

## 2015-02-25 NOTE — Interval H&P Note (Signed)
History and Physical Interval Note:  02/25/2015 12:44 PM  Leah Hall  has presented today for surgery, with the diagnosis of right breast papilloma  The various methods of treatment have been discussed with the patient and family. After consideration of risks, benefits and other options for treatment, the patient has consented to  Procedure(s): RIGHT BREAST LUMPECTOMY WITH RADIOACTIVE SEED LOCALIZATION (Right) as a surgical intervention .  The patient's history has been reviewed, patient examined, no change in status, stable for surgery.  I have reviewed the patient's chart and labs.  Questions were answered to the patient's satisfaction.     Leah Hall

## 2015-02-25 NOTE — Anesthesia Preprocedure Evaluation (Signed)
Anesthesia Evaluation  Patient identified by MRN, date of birth, ID band Patient awake    Reviewed: Allergy & Precautions, NPO status , Patient's Chart, lab work & pertinent test results  Airway Mallampati: II  TM Distance: >3 FB Neck ROM: Full    Dental no notable dental hx.    Pulmonary neg pulmonary ROS, former smoker,    Pulmonary exam normal breath sounds clear to auscultation       Cardiovascular hypertension, Pt. on medications Normal cardiovascular exam Rhythm:Regular Rate:Normal     Neuro/Psych negative neurological ROS  negative psych ROS   GI/Hepatic negative GI ROS, Neg liver ROS,   Endo/Other  negative endocrine ROS  Renal/GU negative Renal ROS  negative genitourinary   Musculoskeletal negative musculoskeletal ROS (+)   Abdominal   Peds negative pediatric ROS (+)  Hematology negative hematology ROS (+)   Anesthesia Other Findings   Reproductive/Obstetrics negative OB ROS                             Anesthesia Physical Anesthesia Plan  ASA: II  Anesthesia Plan: General   Post-op Pain Management:    Induction: Intravenous  Airway Management Planned: LMA  Additional Equipment:   Intra-op Plan:   Post-operative Plan: Extubation in OR  Informed Consent: I have reviewed the patients History and Physical, chart, labs and discussed the procedure including the risks, benefits and alternatives for the proposed anesthesia with the patient or authorized representative who has indicated his/her understanding and acceptance.   Dental advisory given  Plan Discussed with: CRNA and Surgeon  Anesthesia Plan Comments:         Anesthesia Quick Evaluation  

## 2015-02-25 NOTE — Anesthesia Procedure Notes (Signed)
Procedure Name: LMA Insertion Date/Time: 02/25/2015 1:02 PM Performed by: Lyndee Leo Pre-anesthesia Checklist: Patient identified, Emergency Drugs available, Suction available and Patient being monitored Patient Re-evaluated:Patient Re-evaluated prior to inductionOxygen Delivery Method: Circle System Utilized Preoxygenation: Pre-oxygenation with 100% oxygen Intubation Type: IV induction Ventilation: Mask ventilation without difficulty LMA: LMA inserted LMA Size: 4.0 Number of attempts: 1 Airway Equipment and Method: Bite block Placement Confirmation: positive ETCO2 Tube secured with: Tape Dental Injury: Teeth and Oropharynx as per pre-operative assessment

## 2015-02-25 NOTE — H&P (Signed)
  History of Present Illness Leah Kitchen T. Na Waldrip MD; 01/31/2015 5:41 PM) The patient is a 74 year old female who presents with a complaint of Breast problems. She is a 74 year old female referred by Leah Hall for recent diagnosis of intraductal papilloma. She has a personal history of left breast cancer about 2003 treated with lumpectomy and sentinel lymph node biopsy. This was by Leah Hall. She had chemotherapy. Recent screening mammogram revealed a possible new density in the right breast. Diagnostic mammogram and ultrasound were performed confirming a 7 mm mass on mammogram and ultrasound showed a 5 mm lobular hypoechoic mass in the right breast at the 10 o'clock position 4 cm from the nipple. A large core needle biopsy was performed. This has revealed intraductal papilloma without atypia. Excision was recommended and she is referred. She had not noted any breast problems such as lump or nipple discharge or bleeding.   Allergies Leah Hall, Leah Hall; 01/31/2015 3:58 PM) Actos *ANTIDIABETICS* Codeine Phosphate *ANALGESICS - OPIOID* Cyclobenzaprine 20 *MUSCULOSKELETAL THERAPY AGENTS* Darvocet A500 *ANALGESICS - OPIOID* Diclofenac *ANALGESICS - ANTI-INFLAMMATORY* Diovan *ANTIHYPERTENSIVES* Lisinopril *ANTIHYPERTENSIVES* MetFORMIN HCl *ANTIDIABETICS*  Medication History Leah Hall, CMA; 01/31/2015 4:00 PM) Xanax (0.5MG  Tablet, Oral) Active. Vitamin C (Oral) Active. Catapres (0.1MG  Tablet, Oral) Active. Co Q10 (30MG  Capsule, Oral) Active. Fish Oil Active. Hydrochlorothiazide (25MG  Tablet, Oral) Active. Multi Vitamin Daily (Oral) Active. Garlique (400MG  Tablet DR, Oral) Active. CloNIDine HCl (0.1MG  Tablet, Oral) Active. Medications Reconciled  Vitals Leah Hall CMA; 01/31/2015 4:01 PM) 01/31/2015 4:00 PM Weight: 133 lb Height: 62.5in Body Surface Area: 1.62 m Body Mass Index: 23.94 kg/m  Temp.: 97.8F(Temporal)  Pulse: 69 (Regular)  BP:  122/70 (Sitting, Left Arm, Standard)       Physical Exam Leah Kitchen T. Beda Dula MD; 01/31/2015 5:42 PM) The physical exam findings are as follows: Note:General: Thin elderly Caucasian female no distress Skin: No rash or infection Lungs: Clear equal breath sounds without wheezing Cardiac: Regular rate and rhythm Lymph nodes: No cervical, supra clavicular or axillary nodes palpable Breasts: Small thickening and hematoma at the biopsy site upper outer right breast. No other palpable masses. Previous healed circumareolar incision on the right and healed lumpectomy on the left without mass. Neurologic: Alert and fully oriented. Gait normal.    Assessment & Plan Leah Kitchen T. Collins Dimaria MD; 01/31/2015 5:44 PM) PAPILLOMA OF BREAST, RIGHT (D24.1) Impression: I discussed this finding with the patient and with her son by telephone. We discussed that our standard recommendation locally is excision to rule out an early underlying carcinoma. Previous review of our data has shown an approximately 10% rate of underlying early carcinoma. I discussed the option of mammographic follow-up. Particularly with the patient's personal history of breast cancer I would lean toward excision and this is what she definitely wants. We discussed the procedure of radioactive seed localized right breast lumpectomy as an outpatient under general anesthesia. Risks of bleeding and infection were discussed. She is in agreement and we will schedule this at her convenience. Current Plans Schedule for Surgery Radioactive seed localized right breast lumpectomy  Pt Education - [facility] Breast Biopsy HCI: discussed with patient and provided information.

## 2015-02-26 ENCOUNTER — Encounter (HOSPITAL_BASED_OUTPATIENT_CLINIC_OR_DEPARTMENT_OTHER): Payer: Self-pay | Admitting: General Surgery

## 2017-01-06 ENCOUNTER — Other Ambulatory Visit (HOSPITAL_COMMUNITY): Payer: Self-pay | Admitting: Physician Assistant

## 2017-01-06 ENCOUNTER — Ambulatory Visit (HOSPITAL_COMMUNITY)
Admission: RE | Admit: 2017-01-06 | Discharge: 2017-01-06 | Disposition: A | Discharge status: Home / Self Care | Payer: MEDICARE | Source: Ambulatory Visit | Attending: Physician Assistant | Admitting: Physician Assistant

## 2017-01-06 DIAGNOSIS — M25572 Pain in left ankle and joints of left foot: Secondary | ICD-10-CM

## 2017-01-06 DIAGNOSIS — M7989 Other specified soft tissue disorders: Secondary | ICD-10-CM | POA: Diagnosis not present

## 2017-11-17 ENCOUNTER — Other Ambulatory Visit: Payer: Self-pay | Admitting: Family Medicine

## 2017-11-17 DIAGNOSIS — Z1239 Encounter for other screening for malignant neoplasm of breast: Secondary | ICD-10-CM

## 2018-04-17 IMAGING — DX DG ANKLE COMPLETE 3+V*L*
3 series · 3 of 3 positions shown · non-contrast
Comparison: No recent prior .

CLINICAL DATA: Ankle pain. Redness and swelling. No known injury .

EXAM:
LEFT ANKLE COMPLETE - 3+ VIEW

[ankle ap]
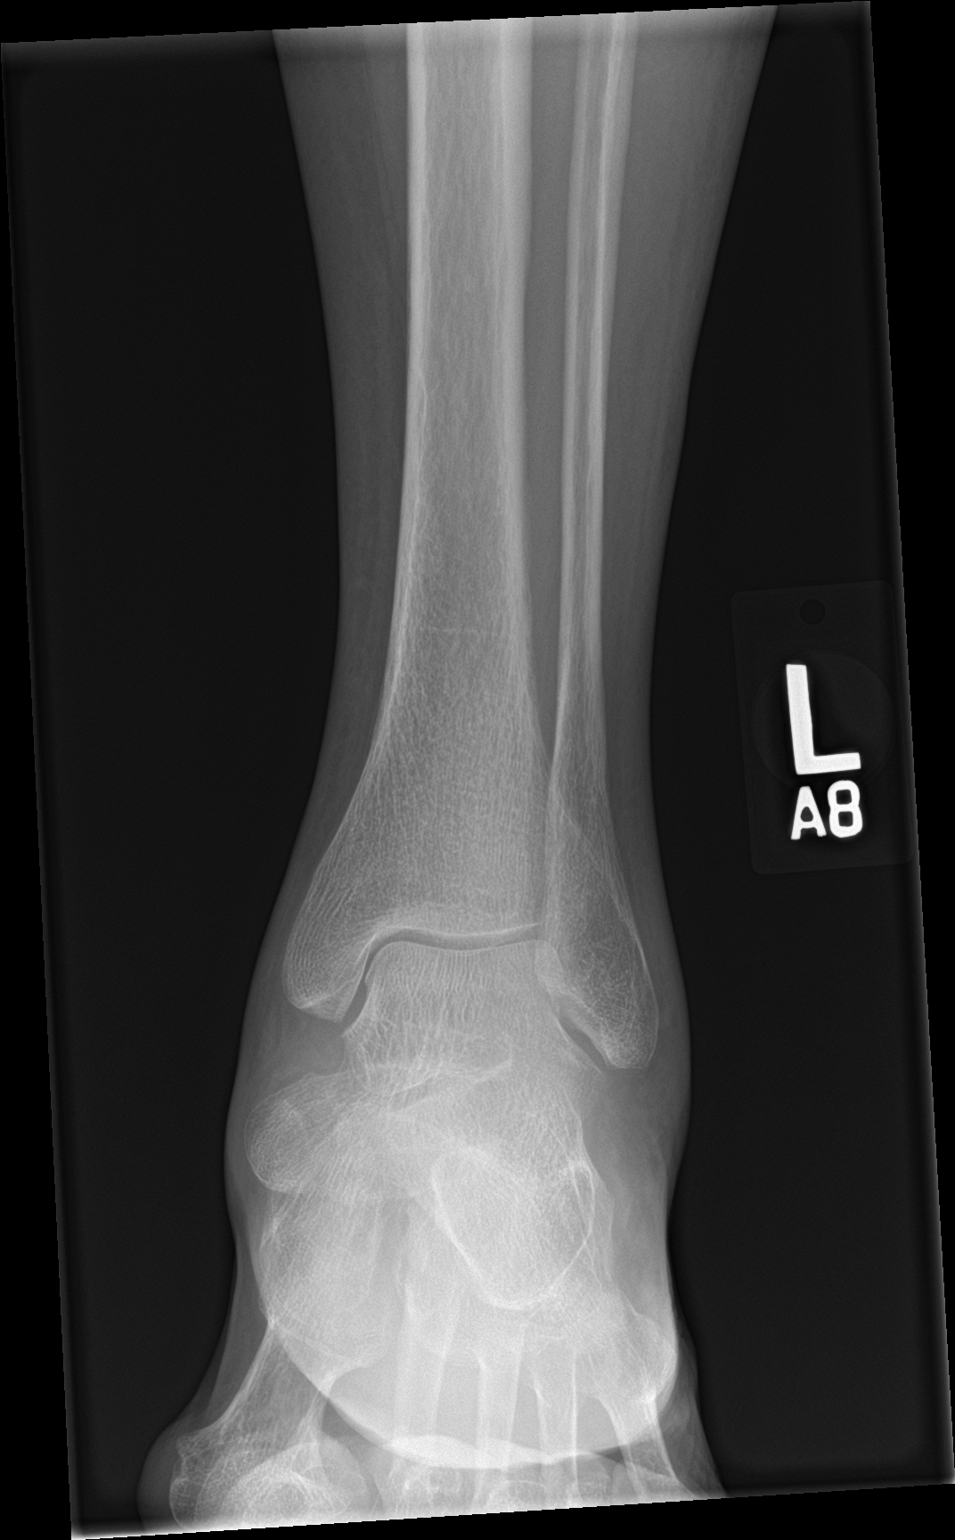

[ankle obl]
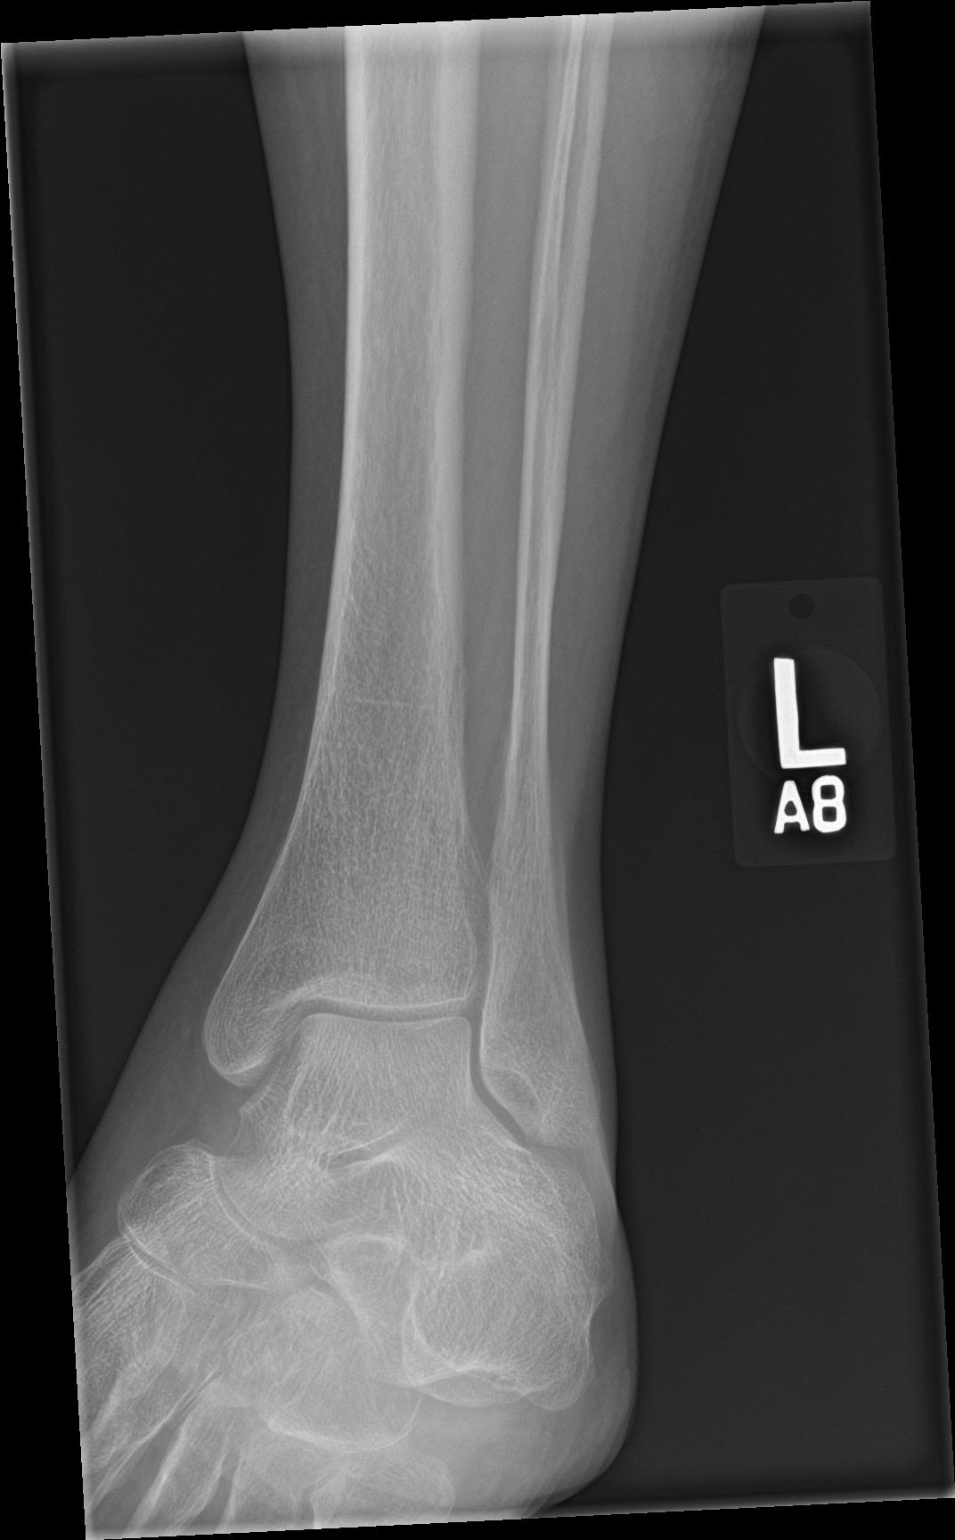

[ankle lat]
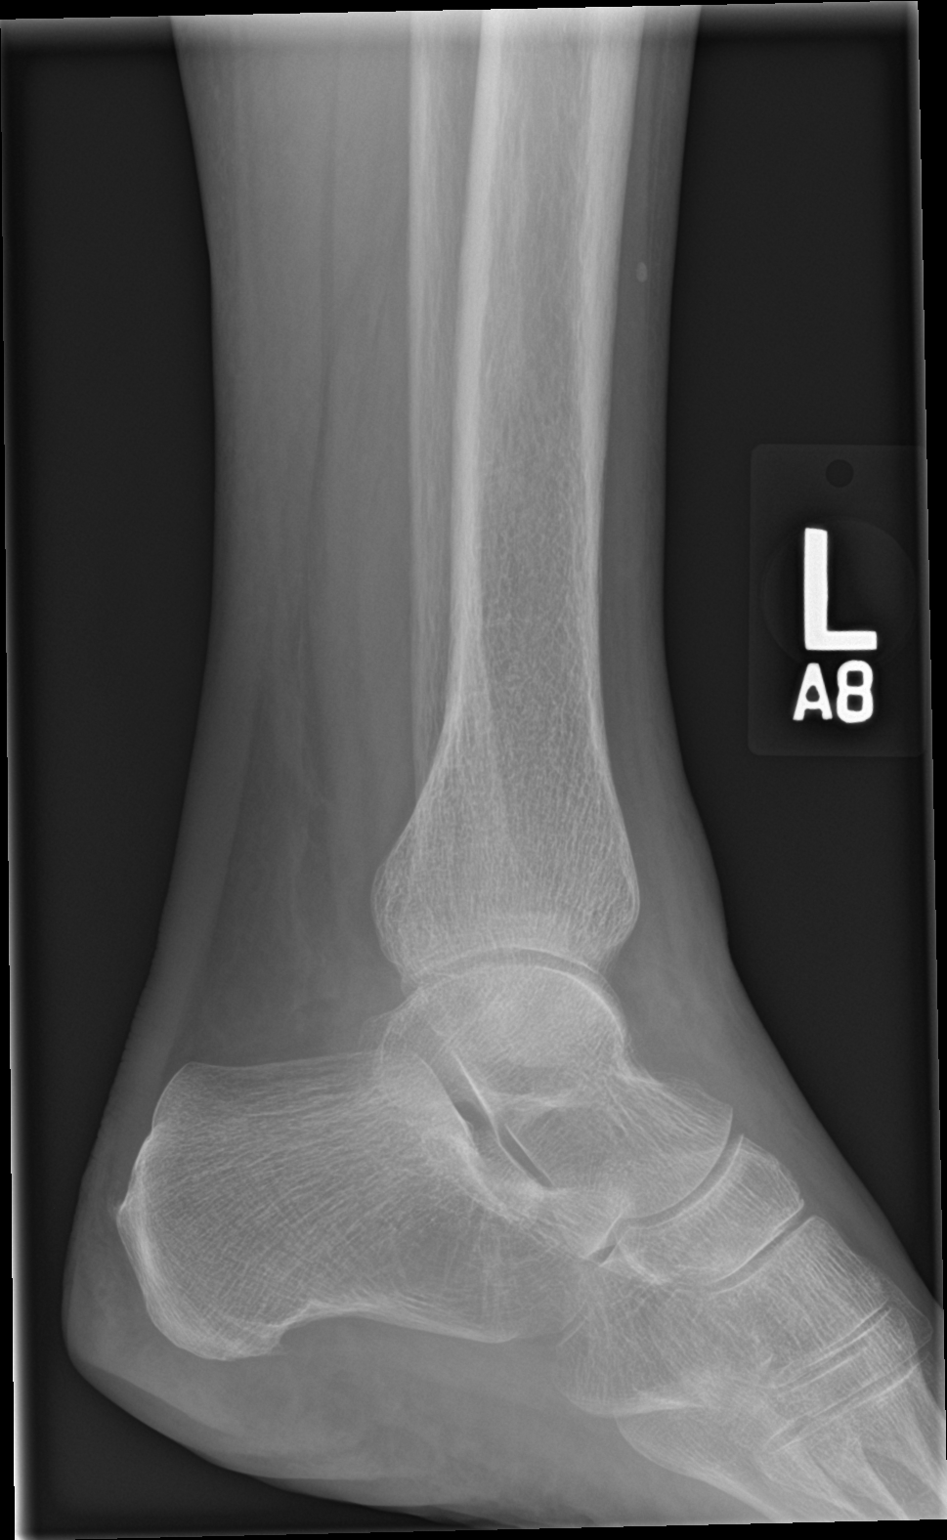

[3 of 3 positions shown; findings below may reference images not displayed]

FINDINGS: Diffuse soft tissue swelling. No acute bony or joint abnormality
identified. No evidence of fracture dislocation.
IMPRESSION: Diffuse soft tissue swelling.  No acute abnormality .
